# Patient Record
Sex: Female | Born: 1960 | Race: Black or African American | Hispanic: No | Marital: Single | Smoking: Current every day smoker
Health system: Southern US, Community
[De-identification: ages and names within clinical notes are randomized; demographics above are authoritative.]

## PROBLEM LIST (undated history)

## (undated) DIAGNOSIS — K219 Gastro-esophageal reflux disease without esophagitis: Secondary | ICD-10-CM

## (undated) DIAGNOSIS — D649 Anemia, unspecified: Secondary | ICD-10-CM

## (undated) DIAGNOSIS — C50919 Malignant neoplasm of unspecified site of unspecified female breast: Secondary | ICD-10-CM

## (undated) DIAGNOSIS — Z923 Personal history of irradiation: Secondary | ICD-10-CM

## (undated) HISTORY — PX: EXPLORATORY LAPAROTOMY: SUR591

## (undated) HISTORY — PX: COLONOSCOPY: SHX174

## (undated) HISTORY — PX: ABDOMINAL HYSTERECTOMY: SHX81

## (undated) HISTORY — DX: Anemia, unspecified: D64.9

---

## 2001-08-20 ENCOUNTER — Emergency Department (HOSPITAL_COMMUNITY): Admission: EM | Admit: 2001-08-20 | Discharge: 2001-08-20 | Payer: Self-pay | Admitting: Emergency Medicine

## 2001-09-12 ENCOUNTER — Emergency Department (HOSPITAL_COMMUNITY): Admission: EM | Admit: 2001-09-12 | Discharge: 2001-09-12 | Payer: Self-pay | Admitting: Emergency Medicine

## 2002-12-30 ENCOUNTER — Emergency Department (HOSPITAL_COMMUNITY): Admission: EM | Admit: 2002-12-30 | Discharge: 2002-12-30 | Payer: Self-pay | Admitting: Emergency Medicine

## 2005-12-22 ENCOUNTER — Ambulatory Visit: Payer: Self-pay | Admitting: Internal Medicine

## 2005-12-29 ENCOUNTER — Ambulatory Visit (HOSPITAL_COMMUNITY): Admission: RE | Admit: 2005-12-29 | Discharge: 2005-12-29 | Payer: Self-pay | Admitting: Internal Medicine

## 2005-12-29 ENCOUNTER — Ambulatory Visit: Payer: Self-pay | Admitting: Internal Medicine

## 2005-12-29 ENCOUNTER — Encounter (INDEPENDENT_AMBULATORY_CARE_PROVIDER_SITE_OTHER): Payer: Self-pay | Admitting: Specialist

## 2005-12-30 ENCOUNTER — Ambulatory Visit (HOSPITAL_COMMUNITY): Admission: RE | Admit: 2005-12-30 | Discharge: 2005-12-30 | Payer: Self-pay | Admitting: Internal Medicine

## 2006-01-07 ENCOUNTER — Ambulatory Visit: Payer: Self-pay | Admitting: Internal Medicine

## 2006-02-25 ENCOUNTER — Ambulatory Visit: Payer: Self-pay | Admitting: Gastroenterology

## 2006-08-04 ENCOUNTER — Ambulatory Visit (HOSPITAL_COMMUNITY): Admission: RE | Admit: 2006-08-04 | Discharge: 2006-08-04 | Payer: Self-pay | Admitting: Pulmonary Disease

## 2006-08-26 ENCOUNTER — Ambulatory Visit (HOSPITAL_COMMUNITY): Admission: RE | Admit: 2006-08-26 | Discharge: 2006-08-26 | Payer: Self-pay | Admitting: Pulmonary Disease

## 2007-11-22 ENCOUNTER — Ambulatory Visit (HOSPITAL_COMMUNITY): Admission: RE | Admit: 2007-11-22 | Discharge: 2007-11-22 | Payer: Self-pay | Admitting: Pulmonary Disease

## 2009-05-10 ENCOUNTER — Ambulatory Visit (HOSPITAL_COMMUNITY): Admission: RE | Admit: 2009-05-10 | Discharge: 2009-05-10 | Payer: Self-pay | Admitting: Pulmonary Disease

## 2010-07-26 NOTE — Op Note (Signed)
NAME:  Maria Kirby, Maria Kirby                 ACCOUNT NO.:  [id number]   MEDICAL RECORD NO.:  [id number]          PATIENT TYPE:  AMB   LOCATION:  DAY                           FACILITY:  [facility]   PHYSICIAN:  R. Roetta Sessions, M.D. DATE OF BIRTH:  January 03, 1961   DATE OF PROCEDURE:  12/29/2005  DATE OF DISCHARGE:                                 OPERATIVE REPORT   PROCEDURE:  Colonoscopy, ileoscopy, biopsy, snare polypectomy ablation.   INDICATIONS FOR PROCEDURE:  The patient is a 50 year old lady with positive  family history of colon cancer in a first degree relative at a young age  with a 1-year history of diarrhea.  She is having some postprandial  retroxiphoid pain as well.  Colonoscopy is now being done.  This approach  has discussed with the patient at length.  Potential risks, benefits and  first have been reviewed, questions answered.  She is agreeable.  Please see  documentation in the medical record.   PROCEDURE NOTE:  O2 saturation, blood pressure, pulse and respirations were  monitored throughout the entire procedure.   CONSCIOUS SEDATION:  Versed 4 mg IV, Demerol 75 mg IV.   INSTRUMENT:  Olympus video chip system.   FINDINGS:  Digital rectal exam revealed no abnormalities.   ENDOSCOPIC FINDINGS:  The prep was good.  Rectum:  Examination of rectal mucosa including retroflex view of anal verge  revealed no abnormalities.  Colon:  Colonic mucosa was surveyed from the rectosigmoid junction through  the left, transverse, right colon to appendiceal orifice, ileocecal valve  and cecum.  Distal 20 cm terminal ileum was also inspected.  From this level  scope slowly withdrawn and all previously mentioned  mucosal surfaces were  again seen.  The terminal ileal mucosa appeared normal.  The patient had  multiple diminutive polyps in the sigmoid colon, had one 6 mm flat sessile  polyp which was removed with hot snare technique recovered through the  scope.  Stool sample suctioned out for  microbiology studies.  Biopsies of  sigmoid colon taken to rule out microscopic colitis.  Remainder of colonic  mucosa appeared normal.  The patient tolerated the procedure well, was  reacted in endoscopy.   IMPRESSION:  Diminutive sigmoid polyps ablated.  One flat polyp removed with  snare.  Remainder of rectum colonic mucosa appeared normal.  Stool sample  collected.   We will follow up on the biopsies and stool samples from today.  Will make  further recommendations.  As a separate issue she has postprandial abdominal  pain which is suspicious for biliary origin.  Will go ahead and get an  outpatient abdominal ultrasound and make further recommendations in the very  near future.      Jonathon Bellows, M.D.  Electronically Signed     RMR/MEDQ  D:  12/29/2005  T:  12/30/2005  Job:  [id number]   cc:   Ramon Dredge L. Juanetta Gosling, M.D.  Fax: [phone]

## 2010-07-26 NOTE — Consult Note (Signed)
[name], Kirby                 ACCOUNT NO.:  [id number]   MEDICAL RECORD NO.:  [id number]          PATIENT TYPE:  AMB   LOCATION:                                FACILITY:  [facility]   PHYSICIAN:  R. Roetta Sessions, M.D. DATE OF BIRTH:  04/24/1960   DATE OF CONSULTATION:  12/22/2005  DATE OF DISCHARGE:                                   CONSULTATION   REASON FOR CONSULTATION:  Diarrhea, positive family history of colon cancer.   Maria Kirby is a pleasant 50 year old African-American female who was  sent to the emergency room by Dr. Shaune Pollack.  __________ one-year history  of __________  worsening diarrhea.  She tells me that prior to this past  year, she had 1-2 formed bowel movements daily.  Now she has at least 4  watery, loose, nonbloody bowel movements daily, and sometimes at least  double that amount.  She sometimes gets lower abdominal cramps and diarrhea  after she eats a meal.  She does not have nocturnal diarrhea.  She has  gained 10 pounds over the past one year.  She has not had any blood per  rectum.   FAMILY HISTORY:  Colon cancer in her father who was diagnosed with the  disease at age 75.   There is no family history of inflammatory bowel disease, including UC or  Crohn's disease.   She does not really give a history consistent with a lactose intolerance.  She has not been on antibiotics recently.  She does not have any reflux  symptoms, odynophagia or dysphagia.  She does have intermittent postprandial  retroxiphoid pain radiating into her back.  Her gallbladder remains in situ.  She rarely consumes an ibuprofen for various aches and pains.  She takes  over-the-counter sleep aids at bedtime, otherwise no prescribed medications.   PAST MEDICAL HISTORY:  Significant for hypothyroid disease, hypothyroidism.   PAST SURGICAL HISTORY:  Hysterectomy with appendectomy.  Exploratory surgery  for lower abdominal pain many years ago at [facility]; nothing  apparently was found and those symptoms resolved on their own.   MEDICATIONS:  Tylenol Extra-Strength and occasional ibuprofen, sleep aid OTC  at bedtime.   ALLERGIES:  ASPIRIN, IVP dye.   FAMILY HISTORY:  Father died with colorectal cancer at age 87.  Mother died  with diabetes-related complications at age 83.  She has one sister with  irritable bowel syndrome, otherwise negative for chronic GI or liver  illness.  One brother __________ lung cancer.   SOCIAL HISTORY:  The patient is single and she has one child.  She works for  [employer].  She smokes one-half pack of cigarettes per day.  She  does not use any alcohol or illicit drugs.   REVIEW OF SYSTEMS:  No chest pain, no dyspnea on exertion.  No fevers or  chills.  She has gained 10 pounds over the past one year, otherwise GI  review of systems as in history of present illness.   PHYSICAL EXAMINATION:  GENERAL:  Pleasant 50 year old lady resting  comfortably, weight 159, height  5 foot 8-1/2 inches.  VITAL SIGNS:  Temperature 98, blood pressure 119/70, pulse 64.  SKIN:  Warm and dry, __________ .  HEENT:  No scleral icterus.  Conjunctivae are pink.  Oral cavity - no  lesions.  CHEST:  Lungs are clear to auscultation.  CARDIOVASCULAR:  Regular rate and rhythm, no murmurs, gallops or rubs.  BREAST:  Deferred.  ABDOMEN:  Flat, positive bowel sounds, soft, nontender without appreciable  mass or organomegaly.  EXTREMITIES:  No edema.  RECTAL:  Deferred __________ colonoscopy.   IMPRESSION:  Maria Kirby is a pleasant 50 year old lady with a positive  family history at a relatively young age for colon cancer in first-degree  relative who has a one-year history of __________ progressive diarrhea.  Etiology is not clear at this time.  She really does not give a history of  any blood per rectum or any other colitis symptoms per se.  She does have  intermittent postprandial retroxiphoid pain radiating into her back which  is  suspicious for biliary origin.   RECOMMENDATIONS:  Colonoscopy in the near future.  Potential risks, benefits  and alternatives have been reviewed and questions answered.  Will also  consider a gallbladder ultrasound at some point in the near future as well.   Further recommendations to follow.   I thank Dr. Shaune Pollack for allowing me to see this nice lady today.      Jonathon Bellows, M.D.  Electronically Signed     RMR/MEDQ  D:  12/22/2005  T:  12/22/2005  Job:  [id number]

## 2010-11-29 ENCOUNTER — Encounter: Payer: Self-pay | Admitting: Internal Medicine

## 2011-02-17 ENCOUNTER — Telehealth: Payer: Self-pay | Admitting: Pulmonary Disease

## 2011-02-17 NOTE — Telephone Encounter (Signed)
Wrong patient.  We wait for correct patient to call back, as this is the only number patient gave.

## 2011-02-17 NOTE — Telephone Encounter (Signed)
I have called # listed and is no one their by this name. I called #'s listed in chart and did not know who who we where. Will send back to melanie.

## 2011-10-03 ENCOUNTER — Other Ambulatory Visit (HOSPITAL_COMMUNITY): Payer: Self-pay | Admitting: Pulmonary Disease

## 2011-10-03 DIAGNOSIS — M542 Cervicalgia: Secondary | ICD-10-CM

## 2011-10-06 ENCOUNTER — Ambulatory Visit (HOSPITAL_COMMUNITY)
Admission: RE | Admit: 2011-10-06 | Discharge: 2011-10-06 | Disposition: A | Discharge status: Home / Self Care | Payer: BC Managed Care – PPO | Source: Ambulatory Visit | Attending: Pulmonary Disease | Admitting: Pulmonary Disease

## 2011-10-06 ENCOUNTER — Other Ambulatory Visit (HOSPITAL_COMMUNITY): Payer: Self-pay | Admitting: Pulmonary Disease

## 2011-10-06 DIAGNOSIS — M542 Cervicalgia: Secondary | ICD-10-CM | POA: Insufficient documentation

## 2011-10-06 DIAGNOSIS — R52 Pain, unspecified: Secondary | ICD-10-CM

## 2011-10-06 DIAGNOSIS — M4802 Spinal stenosis, cervical region: Secondary | ICD-10-CM | POA: Insufficient documentation

## 2014-02-15 ENCOUNTER — Ambulatory Visit (INDEPENDENT_AMBULATORY_CARE_PROVIDER_SITE_OTHER): Payer: BC Managed Care – PPO | Admitting: Advanced Practice Midwife

## 2014-02-15 ENCOUNTER — Encounter: Payer: Self-pay | Admitting: Advanced Practice Midwife

## 2014-02-15 VITALS — BP 110/64 | Ht 68.0 in | Wt 169.0 lb

## 2014-02-15 DIAGNOSIS — Z01419 Encounter for gynecological examination (general) (routine) without abnormal findings: Secondary | ICD-10-CM

## 2014-02-15 NOTE — Patient Instructions (Signed)
[phone] to schedule mammogram

## 2014-02-15 NOTE — Progress Notes (Signed)
Maria Kirby 53 y.o.  Filed Vitals:   02/15/14 1545  BP: 110/64     Past Medical History: Past Medical History  Diagnosis Date  . Anemia     Past Surgical History: Past Surgical History  Procedure Laterality Date  . Abdominal hysterectomy    . Exploratory laparotomy      Family History: Family History  Problem Relation Age of Onset  . Diabetes Mother   . Hypertension Mother   . Cancer Father     colon    Social History: History  Substance Use Topics  . Smoking status: Current Every Day Smoker -- 0.50 packs/day    Types: Cigarettes  . Smokeless tobacco: Never Used  . Alcohol Use: No    Allergies:  Allergies  Allergen Reactions  . Dye Fdc Red [Red Dye]      Current outpatient prescriptions: TACLONEX external suspension, Apply 1 drop topically daily. , Disp: , Rfl: 3  History of Present Illness: Here for gyn exam and physical.  Had ab hyst for fibroids.  Sees Dr. Luan Pulling for PCP.  No c/o. Only 1 mammogram >10 years ago (didn't know she needed them every year)  Had colonsocopy age 53 after a bout of C Diff.     Review of Systems   Patient denies any headaches, blurred vision, shortness of breath, chest pain, abdominal pain, problems with bowel movements, urination, or intercourse.   Physical Exam: General:  Well developed, well nourished, no acute distress Skin:  Warm and dry Neck:  Midline trachea, normal thyroid Lungs; Clear to auscultation bilaterally Breast:  No dominant palpable mass, retraction, or nipple discharge Cardiovascular: Regular rate and rhythm Abdomen:  Soft, non tender, no hepatosplenomegaly Pelvic:  External genitalia is normal in appearance.  The vagina is normal in appearance. No adnexal masses or tenderness noted.  Rectum:  hemocult negative Extremities:  No swelling or varicosities noted Psych:  No mood changes.     Impression: Normal GYN exam     Plan: mammogram asap

## 2014-04-05 ENCOUNTER — Other Ambulatory Visit (HOSPITAL_COMMUNITY): Payer: Self-pay | Admitting: Pulmonary Disease

## 2014-04-05 DIAGNOSIS — Z1231 Encounter for screening mammogram for malignant neoplasm of breast: Secondary | ICD-10-CM

## 2014-04-10 ENCOUNTER — Ambulatory Visit (HOSPITAL_COMMUNITY)
Admission: RE | Admit: 2014-04-10 | Discharge: 2014-04-10 | Disposition: A | Discharge status: Home / Self Care | Payer: BLUE CROSS/BLUE SHIELD | Source: Ambulatory Visit | Attending: Pulmonary Disease | Admitting: Pulmonary Disease

## 2014-04-10 DIAGNOSIS — Z1231 Encounter for screening mammogram for malignant neoplasm of breast: Secondary | ICD-10-CM | POA: Diagnosis not present

## 2014-04-11 ENCOUNTER — Other Ambulatory Visit: Payer: Self-pay | Admitting: Pulmonary Disease

## 2014-04-11 DIAGNOSIS — R928 Other abnormal and inconclusive findings on diagnostic imaging of breast: Secondary | ICD-10-CM

## 2014-04-25 ENCOUNTER — Ambulatory Visit (HOSPITAL_COMMUNITY)
Admission: RE | Admit: 2014-04-25 | Discharge: 2014-04-25 | Disposition: A | Discharge status: Home / Self Care | Payer: BLUE CROSS/BLUE SHIELD | Source: Ambulatory Visit | Attending: Pulmonary Disease | Admitting: Pulmonary Disease

## 2014-04-25 DIAGNOSIS — R928 Other abnormal and inconclusive findings on diagnostic imaging of breast: Secondary | ICD-10-CM | POA: Diagnosis present

## 2014-10-04 ENCOUNTER — Other Ambulatory Visit (HOSPITAL_COMMUNITY): Payer: Self-pay | Admitting: Pulmonary Disease

## 2014-10-04 DIAGNOSIS — Z780 Asymptomatic menopausal state: Secondary | ICD-10-CM

## 2014-10-10 ENCOUNTER — Ambulatory Visit (HOSPITAL_COMMUNITY)
Admission: RE | Admit: 2014-10-10 | Discharge: 2014-10-10 | Disposition: A | Discharge status: Home / Self Care | Payer: BLUE CROSS/BLUE SHIELD | Source: Ambulatory Visit | Attending: Pulmonary Disease | Admitting: Pulmonary Disease

## 2014-10-10 ENCOUNTER — Other Ambulatory Visit: Payer: Self-pay | Admitting: Pulmonary Disease

## 2014-10-10 ENCOUNTER — Other Ambulatory Visit (HOSPITAL_COMMUNITY): Payer: BLUE CROSS/BLUE SHIELD

## 2014-10-10 DIAGNOSIS — Z720 Tobacco use: Secondary | ICD-10-CM | POA: Insufficient documentation

## 2014-10-10 DIAGNOSIS — R921 Mammographic calcification found on diagnostic imaging of breast: Secondary | ICD-10-CM | POA: Insufficient documentation

## 2014-10-10 DIAGNOSIS — Z780 Asymptomatic menopausal state: Secondary | ICD-10-CM

## 2014-10-10 DIAGNOSIS — R928 Other abnormal and inconclusive findings on diagnostic imaging of breast: Secondary | ICD-10-CM

## 2014-10-10 DIAGNOSIS — R29890 Loss of height: Secondary | ICD-10-CM | POA: Insufficient documentation

## 2015-04-04 ENCOUNTER — Other Ambulatory Visit: Payer: Self-pay | Admitting: Pulmonary Disease

## 2015-04-04 DIAGNOSIS — N6320 Unspecified lump in the left breast, unspecified quadrant: Secondary | ICD-10-CM

## 2015-04-05 ENCOUNTER — Other Ambulatory Visit: Payer: Self-pay | Admitting: Pulmonary Disease

## 2015-04-05 DIAGNOSIS — N6320 Unspecified lump in the left breast, unspecified quadrant: Secondary | ICD-10-CM

## 2015-04-17 ENCOUNTER — Ambulatory Visit (HOSPITAL_COMMUNITY)
Admission: RE | Admit: 2015-04-17 | Discharge: 2015-04-17 | Disposition: A | Discharge status: Home / Self Care | Payer: BLUE CROSS/BLUE SHIELD | Source: Ambulatory Visit | Attending: Pulmonary Disease | Admitting: Pulmonary Disease

## 2015-04-17 DIAGNOSIS — N63 Unspecified lump in breast: Secondary | ICD-10-CM | POA: Diagnosis not present

## 2015-04-17 DIAGNOSIS — N6320 Unspecified lump in the left breast, unspecified quadrant: Secondary | ICD-10-CM

## 2015-09-17 ENCOUNTER — Telehealth: Payer: Self-pay | Admitting: Internal Medicine

## 2015-09-17 NOTE — Telephone Encounter (Signed)
Letter mailed to pt.  

## 2015-09-17 NOTE — Telephone Encounter (Signed)
AUG RECALL FOR 10 YR TCS

## 2015-10-09 ENCOUNTER — Telehealth: Payer: Self-pay

## 2015-10-09 NOTE — Telephone Encounter (Signed)
[phone]  pt received letter to schedule tcs

## 2015-10-18 NOTE — Telephone Encounter (Signed)
LMOM to call.

## 2015-10-19 ENCOUNTER — Telehealth: Payer: Self-pay

## 2015-10-19 NOTE — Telephone Encounter (Signed)
Pt received a triage letter from [facility]. Please call [phone]

## 2015-10-19 NOTE — Telephone Encounter (Signed)
See triage

## 2015-10-31 NOTE — Telephone Encounter (Signed)
Gastroenterology Pre-Procedure Review  Request Date: 10/19/2015 Requesting Physician: ON RECALL FOR 10 YEAR COLONOSCOPY  PATIENT REVIEW QUESTIONS: The patient responded to the following health history questions as indicated:    1. Diabetes Melitis: no 2. Joint replacements in the past 12 months: no 3. Major health problems in the past 3 months: no 4. Has an artificial valve or MVP: no 5. Has a defibrillator: no 6. Has been advised in past to take antibiotics in advance of a procedure like teeth cleaning: no 7. Family history of colon cancer: YES/ FATHER AGE 52 8. Alcohol Use: no 9. History of sleep apnea: no     MEDICATIONS & ALLERGIES:    Patient reports the following regarding taking any blood thinners:   Plavix? no Aspirin? no Coumadin? no  Patient confirms/reports the following medications:  Current Outpatient Prescriptions  Medication Sig Dispense Refill  . TACLONEX external suspension Apply 1 drop topically daily.   3   No current facility-administered medications for this visit.     Patient confirms/reports the following allergies:  Allergies  Allergen Reactions  . Dye Fdc Red [Red Dye]     No orders of the defined types were placed in this encounter.   AUTHORIZATION INFORMATION Primary Insurance:   ID #:  Group #:  Pre-Cert / Auth required  Pre-Cert / Auth #:   Secondary Insurance: ID #:  Group #:  Pre-Cert / Auth required:  Pre-Cert / Auth #:   SCHEDULE INFORMATION: Procedure has been scheduled as follows:  Date: 11/07/2015              Time:  8:30 sM Location: [facility] Short Stay  This Gastroenterology Pre-Precedure Review Form is being routed to the following provider(s): R. Garfield Cornea, MD

## 2015-11-05 ENCOUNTER — Other Ambulatory Visit: Payer: Self-pay

## 2015-11-05 DIAGNOSIS — Z800 Family history of malignant neoplasm of digestive organs: Secondary | ICD-10-CM

## 2015-11-05 MED ORDER — PEG 3350-KCL-NA BICARB-NACL 420 G PO SOLR: 4000.0000 mL | ORAL | 0 refills | Status: DC

## 2015-11-05 NOTE — Telephone Encounter (Signed)
Appropriate.

## 2015-11-05 NOTE — Telephone Encounter (Signed)
I [facility] and also [facility] to [facility] with [facility].  I called pt and LMOM that it has been sent in and to make sure she picks up enema and Ducolax tablets at the pharmacy when she picks up the prescription.  I also left Vm that she should begin clear liquids today after her lunch meal and remain on clear liquids.

## 2015-11-05 NOTE — Addendum Note (Signed)
Addended by: Everardo All on: 11/05/2015 11:23 AM   Modules accepted: Orders

## 2015-11-06 ENCOUNTER — Telehealth: Payer: Self-pay

## 2015-11-06 NOTE — Telephone Encounter (Signed)
I called [employer] @ [phone] and got the automated machine. NO PA required for the screening colonoscopy. Reference # [id number] Confirmation # [id number]

## 2015-11-07 ENCOUNTER — Encounter (HOSPITAL_COMMUNITY)
Admission: RE | Disposition: A | Discharge status: Home / Self Care | Payer: Self-pay | Source: Ambulatory Visit | Attending: Internal Medicine

## 2015-11-07 ENCOUNTER — Encounter (HOSPITAL_COMMUNITY): Payer: Self-pay | Admitting: *Deleted

## 2015-11-07 ENCOUNTER — Ambulatory Visit (HOSPITAL_COMMUNITY)
Admission: RE | Admit: 2015-11-07 | Discharge: 2015-11-07 | Disposition: A | Discharge status: Home / Self Care | Payer: BLUE CROSS/BLUE SHIELD | Source: Ambulatory Visit | Attending: Internal Medicine | Admitting: Internal Medicine

## 2015-11-07 DIAGNOSIS — F17210 Nicotine dependence, cigarettes, uncomplicated: Secondary | ICD-10-CM | POA: Diagnosis not present

## 2015-11-07 DIAGNOSIS — D123 Benign neoplasm of transverse colon: Secondary | ICD-10-CM | POA: Diagnosis not present

## 2015-11-07 DIAGNOSIS — K635 Polyp of colon: Secondary | ICD-10-CM | POA: Diagnosis not present

## 2015-11-07 DIAGNOSIS — Z1211 Encounter for screening for malignant neoplasm of colon: Secondary | ICD-10-CM | POA: Diagnosis not present

## 2015-11-07 DIAGNOSIS — Z800 Family history of malignant neoplasm of digestive organs: Secondary | ICD-10-CM | POA: Diagnosis not present

## 2015-11-07 HISTORY — PX: COLONOSCOPY: SHX5424

## 2015-11-07 SURGERY — COLONOSCOPY
Anesthesia: Moderate Sedation

## 2015-11-07 MED ORDER — ONDANSETRON HCL 4 MG/2ML IJ SOLN
INTRAMUSCULAR | Status: DC | PRN
  Administered 2015-11-07: 4 mg via INTRAVENOUS

## 2015-11-07 MED ORDER — STERILE WATER FOR IRRIGATION IR SOLN
Status: DC | PRN
  Administered 2015-11-07: 09:00:00

## 2015-11-07 MED ORDER — MIDAZOLAM HCL 5 MG/5ML IJ SOLN
INTRAMUSCULAR | Status: AC
  Filled 2015-11-07: qty 10

## 2015-11-07 MED ORDER — MEPERIDINE HCL 100 MG/ML IJ SOLN
INTRAMUSCULAR | Status: DC | PRN
  Administered 2015-11-07: 25 mg via INTRAVENOUS
  Administered 2015-11-07: 50 mg via INTRAVENOUS

## 2015-11-07 MED ORDER — SODIUM CHLORIDE 0.9 % IV SOLN
INTRAVENOUS | Status: DC
  Administered 2015-11-07: 08:00:00 via INTRAVENOUS

## 2015-11-07 MED ORDER — MIDAZOLAM HCL 5 MG/5ML IJ SOLN
INTRAMUSCULAR | Status: DC | PRN
  Administered 2015-11-07: 2 mg via INTRAVENOUS
  Administered 2015-11-07 (×2): 1 mg via INTRAVENOUS
  Administered 2015-11-07: 2 mg via INTRAVENOUS

## 2015-11-07 MED ORDER — ONDANSETRON HCL 4 MG/2ML IJ SOLN
INTRAMUSCULAR | Status: AC
  Filled 2015-11-07: qty 2

## 2015-11-07 MED ORDER — MEPERIDINE HCL 100 MG/ML IJ SOLN
INTRAMUSCULAR | Status: AC
  Filled 2015-11-07: qty 2

## 2015-11-07 NOTE — Op Note (Signed)
[facility] Patient Name: Maria Kirby Procedure Date: 11/07/2015 8:38 AM MRN: [id number] Date of Birth: 01/11/1961 Attending MD: Norvel Richards , MD CSN: [id number] Age: 55 Admit Type: Outpatient Procedure:                Colonoscopy with multiple snare polypectomies Indications:              Screening in patient at increased risk: Colorectal                            cancer in father before age 2 Providers:                Norvel Richards, MD, Janeece Riggers, RN, Randa Spike, Technician Referring MD:              Medicines:                Midazolam 6 mg IV, Meperidine 175 mg IV,                            Ondansetron 4 mg IV Complications:            No immediate complications. Estimated Blood Loss:     Estimated blood loss was minimal. Procedure:                Pre-Anesthesia Assessment:                           - Prior to the procedure, a History and Physical                            was performed, and patient medications and                            allergies were reviewed. The patient's tolerance of                            previous anesthesia was also reviewed. The risks                            and benefits of the procedure and the sedation                            options and risks were discussed with the patient.                            All questions were answered, and informed consent                            was obtained. Prior Anticoagulants: The patient has                            taken no previous anticoagulant or antiplatelet  agents. ASA Grade Assessment: II - A patient with                            mild systemic disease. After reviewing the risks                            and benefits, the patient was deemed in                            satisfactory condition to undergo the procedure.                           After obtaining informed consent, the colonoscope             was passed under direct vision. Throughout the                            procedure, the patient's blood pressure, pulse, and                            oxygen saturations were monitored continuously. The                            EC-[id number] scope was introduced through                            the anus and advanced to the the cecum, identified                            by appendiceal orifice and ileocecal valve. The                            colonoscopy was performed without difficulty. The                            patient tolerated the procedure well. The quality                            of the bowel preparation was adequate. The                            ileocecal valve, appendiceal orifice, and rectum                            were photographed. Scope In: 8:59:19 AM Scope Out: 9:19:08 AM Scope Withdrawal Time: 0 hours 15 minutes 48 seconds  Total Procedure Duration: 0 hours 19 minutes 49 seconds  Findings:      The perianal and digital rectal examinations were normal.      (2) 4 mm polyppolyps were found in the splenic flexure. The polyps were       semi-pedunculated. The polyps were removed with cold snare. Resection       and retrieval were complete. Estimated blood loss: minimal. (1)9 mm       sessile polyp in the same area was removed with  hot snare technique.      No additional abnormalities were found on retroflexion. Impression:               - One 4 mm polyp at the splenic flexure, removed                            with a hot snare. Resected and retrieved. Moderate Sedation:      Moderate (conscious) sedation was administered by the endoscopy nurse       and supervised by the endoscopist. The following parameters were       monitored: oxygen saturation, heart rate, blood pressure, respiratory       rate, EKG, adequacy of pulmonary ventilation, and response to care.       Total physician intraservice time was 33 minutes. Recommendation:            - Patient has a contact number available for                            emergencies. The signs and symptoms of potential                            delayed complications were discussed with the                            patient. Return to normal activities tomorrow.                            Written discharge instructions were provided to the                            patient.                           - Advance diet as tolerated.                           - Continue present medications.                           - Repeat colonoscopy after studies are complete for                            surveillance based on pathology results.                           - Return to GI office (date not yet determined). Procedure Code(s):        --- Professional ---                           [phone], Colonoscopy, flexible; with removal of                            tumor(s), polyp(s), or other lesion(s) by snare                            technique  [id number], Moderate sedation services provided by the                            same physician or other qualified health care                            professional performing the diagnostic or                            therapeutic service that the sedation supports,                            requiring the presence of an independent trained                            observer to assist in the monitoring of the                            patient's level of consciousness and physiological                            status; initial 15 minutes of intraservice time,                            patient age 18 years or older                           [phone], Moderate sedation services; each additional                            15 minutes intraservice time Diagnosis Code(s):        --- Professional ---                           Z80.0, Family history of malignant neoplasm of                            digestive organs                            D12.3, Benign neoplasm of transverse colon (hepatic                            flexure or splenic flexure) CPT copyright 2016 [employer]. All rights reserved. The codes documented in this report are preliminary and upon coder review may  be revised to meet current compliance requirements. Maria Kirby. Maria Focht, MD Norvel Richards, MD 11/07/2015 9:31:39 AM This report has been signed electronically. Number of Addenda: 0

## 2015-11-07 NOTE — Discharge Instructions (Addendum)
°Colonoscopy °Discharge Instructions ° °Read the instructions outlined below and refer to this sheet in the next few weeks. These discharge instructions provide you with general information on caring for yourself after you leave the hospital. Your doctor may also give you specific instructions. While your treatment has been planned according to the most current medical practices available, unavoidable complications occasionally occur. If you have any problems or questions after discharge, call Dr. Rourk at [phone]. °ACTIVITY °· You may resume your regular activity, but move at a slower pace for the next 24 hours.  °· Take frequent rest periods for the next 24 hours.  °· Walking will help get rid of the air and reduce the bloated feeling in your belly (abdomen).  °· No driving for 24 hours (because of the medicine (anesthesia) used during the test).   °· Do not sign any important legal documents or operate any machinery for 24 hours (because of the anesthesia used during the test).  °NUTRITION °· Drink plenty of fluids.  °· You may resume your normal diet as instructed by your doctor.  °· Begin with a light meal and progress to your normal diet. Heavy or fried foods are harder to digest and may make you feel sick to your stomach (nauseated).  °· Avoid alcoholic beverages for 24 hours or as instructed.  °MEDICATIONS °· You may resume your normal medications unless your doctor tells you otherwise.  °WHAT YOU CAN EXPECT TODAY °· Some feelings of bloating in the abdomen.  °· Passage of more gas than usual.  °· Spotting of blood in your stool or on the toilet paper.  °IF YOU HAD POLYPS REMOVED DURING THE COLONOSCOPY: °· No aspirin products for 7 days or as instructed.  °· No alcohol for 7 days or as instructed.  °· Eat a soft diet for the next 24 hours.  °FINDING OUT THE RESULTS OF YOUR TEST °Not all test results are available during your visit. If your test results are not back during the visit, make an appointment  with your caregiver to find out the results. Do not assume everything is normal if you have not heard from your caregiver or the medical facility. It is important for you to follow up on all of your test results.  °SEEK IMMEDIATE MEDICAL ATTENTION IF: °· You have more than a spotting of blood in your stool.  °· Your belly is swollen (abdominal distention).  °· You are nauseated or vomiting.  °· You have a temperature over 101.  °· You have abdominal pain or discomfort that is severe or gets worse throughout the day.  ° ° °Colon polyp information provided °Further recommendations to follow pending review of pathology report ° ° °Colon Polyps °Polyps are lumps of extra tissue growing inside the body. Polyps can grow in the large intestine (colon). Most colon polyps are noncancerous (benign). However, some colon polyps can become cancerous over time. Polyps that are larger than a pea may be harmful. To be safe, caregivers remove and test all polyps. °CAUSES  °Polyps form when mutations in the genes cause your cells to grow and divide even though no more tissue is needed. °RISK FACTORS °There are a number of risk factors that can increase your chances of getting colon polyps. They include: °· Being older than 50 years. °· Family history of colon polyps or colon cancer. °· Long-term colon diseases, such as colitis or Crohn disease. °· Being overweight. °· Smoking. °· Being inactive. °· Drinking too much alcohol. °  SYMPTOMS  °Most small polyps do not cause symptoms. If symptoms are present, they may include: °· Blood in the stool. The stool may look dark red or black. °· Constipation or diarrhea that lasts longer than 1 week. °DIAGNOSIS °People often do not know they have polyps until their caregiver finds them during a regular checkup. Your caregiver can use 4 tests to check for polyps: °· Digital rectal exam. The caregiver wears gloves and feels inside the rectum. This test would find polyps only in the rectum. °· Barium  enema. The caregiver puts a liquid called barium into your rectum before taking X-rays of your colon. Barium makes your colon look white. Polyps are dark, so they are easy to see in the X-ray pictures. °· Sigmoidoscopy. A thin, flexible tube (sigmoidoscope) is placed into your rectum. The sigmoidoscope has a light and tiny camera in it. The caregiver uses the sigmoidoscope to look at the last third of your colon. °· Colonoscopy. This test is like sigmoidoscopy, but the caregiver looks at the entire colon. This is the most common method for finding and removing polyps. °TREATMENT  °Any polyps will be removed during a sigmoidoscopy or colonoscopy. The polyps are then tested for cancer. °PREVENTION  °To help lower your risk of getting more colon polyps: °· Eat plenty of fruits and vegetables. Avoid eating fatty foods. °· Do not smoke. °· Avoid drinking alcohol. °· Exercise every day. °· Lose weight if recommended by your caregiver. °· Eat plenty of calcium and folate. Foods that are rich in calcium include milk, cheese, and broccoli. Foods that are rich in folate include chickpeas, kidney beans, and spinach. °HOME CARE INSTRUCTIONS °Keep all follow-up appointments as directed by your caregiver. You may need periodic exams to check for polyps. °SEEK MEDICAL CARE IF: °You notice bleeding during a bowel movement. °  °This information is not intended to replace advice given to you by your health care provider. Make sure you discuss any questions you have with your health care provider. °  °Document Released: 11/21/2003 Document Revised: 03/17/2014 Document Reviewed: 05/06/2011 °Elsevier Interactive Patient Education ©2016 [employer] ° °

## 2015-11-07 NOTE — H&P (Signed)
@  [id number]   Primary Care Physician:  Alonza Bogus, MD Primary Gastroenterologist:  Dr. Gala Romney  Pre-Procedure History & Physical: HPI:  Maria Kirby is a 55 y.o. female is here for a screening colonoscopy. Father with colon cancer age 27. Negative colonoscopy 2007-hyperplastic polyp. No bowel symptoms currently.  Past Medical History:  Diagnosis Date  . Anemia     Past Surgical History:  Procedure Laterality Date  . ABDOMINAL HYSTERECTOMY    . COLONOSCOPY    . EXPLORATORY LAPAROTOMY      Prior to Admission medications   Medication Sig Start Date End Date Taking? Authorizing Provider  polyethylene glycol-electrolytes (TRILYTE) 420 g solution Take 4,000 mLs by mouth as directed. 11/05/15  Yes Daneil Dolin, MD    Allergies as of 11/05/2015 - Review Complete 11/05/2015  Allergen Reaction Noted  . Dye fdc red [red dye]  02/15/2014    Family History  Problem Relation Age of Onset  . Diabetes Mother   . Hypertension Mother   . Cancer Father     colon  . Colon cancer Father     Social History   Social History  . Marital status: Single    Spouse name: N/A  . Number of children: N/A  . Years of education: N/A   Occupational History  . Not on file.   Social History Main Topics  . Smoking status: Current Every Day Smoker    Packs/day: 0.50    Years: 30.00    Types: Cigarettes  . Smokeless tobacco: Never Used  . Alcohol use No  . Drug use: No  . Sexual activity: Not Currently   Other Topics Concern  . Not on file   Social History Narrative  . No narrative on file    Review of Systems: See HPI, otherwise negative ROS  Physical Exam: BP 121/71   Pulse 68   Temp 98.2 F (36.8 C) (Oral)   Resp 15   Ht 5' 7.5" (1.715 m)   Wt 155 lb (70.3 kg)   SpO2 100%   BMI 23.92 kg/m  General:   Alert,  Well-developed, well-nourished, pleasant and cooperative in NAD Head:  Normocephalic and atraumatic. Eyes:  Sclera clear, no icterus.   Conjunctiva pink. Lungs:   Clear throughout to auscultation.   No wheezes, crackles, or rhonchi. No acute distress. Heart:  Regular rate and rhythm; no murmurs, clicks, rubs,  or gallops. Abdomen:  Soft, nontender and nondistended. No masses, hepatosplenomegaly or hernias noted. Normal bowel sounds, without guarding, and without rebound.    Impression/Plan: Maria Kirby is now here to undergo a screening colonoscopy.  High-risk screening examination.  Risks, benefits, limitations, imponderables and alternatives regarding colonoscopy have been reviewed with the patient. Questions have been answered. All parties agreeable.     Notice:  This dictation was prepared with Dragon dictation along with smaller phrase technology. Any transcriptional errors that result from this process are unintentional and may not be corrected upon review.

## 2015-11-09 ENCOUNTER — Encounter: Payer: Self-pay | Admitting: Internal Medicine

## 2015-11-13 ENCOUNTER — Encounter (HOSPITAL_COMMUNITY): Payer: Self-pay | Admitting: Internal Medicine

## 2016-04-08 ENCOUNTER — Other Ambulatory Visit (HOSPITAL_COMMUNITY): Payer: Self-pay | Admitting: Pulmonary Disease

## 2016-04-08 DIAGNOSIS — R921 Mammographic calcification found on diagnostic imaging of breast: Secondary | ICD-10-CM

## 2016-04-08 DIAGNOSIS — R928 Other abnormal and inconclusive findings on diagnostic imaging of breast: Secondary | ICD-10-CM

## 2016-04-29 ENCOUNTER — Ambulatory Visit (HOSPITAL_COMMUNITY)
Admission: RE | Admit: 2016-04-29 | Discharge: 2016-04-29 | Disposition: A | Discharge status: Home / Self Care | Payer: Commercial Managed Care - PPO | Source: Ambulatory Visit | Attending: Pulmonary Disease | Admitting: Pulmonary Disease

## 2016-04-29 ENCOUNTER — Encounter (HOSPITAL_COMMUNITY): Payer: Self-pay

## 2016-04-29 DIAGNOSIS — R921 Mammographic calcification found on diagnostic imaging of breast: Secondary | ICD-10-CM | POA: Insufficient documentation

## 2016-04-29 DIAGNOSIS — R928 Other abnormal and inconclusive findings on diagnostic imaging of breast: Secondary | ICD-10-CM | POA: Diagnosis not present

## 2016-04-29 HISTORY — DX: Malignant neoplasm of unspecified site of unspecified female breast: C50.919

## 2016-04-29 HISTORY — DX: Personal history of irradiation: Z92.3

## 2016-05-07 DIAGNOSIS — M25861 Other specified joint disorders, right knee: Secondary | ICD-10-CM | POA: Diagnosis not present

## 2016-05-07 DIAGNOSIS — L409 Psoriasis, unspecified: Secondary | ICD-10-CM | POA: Diagnosis not present

## 2016-05-19 ENCOUNTER — Emergency Department (HOSPITAL_COMMUNITY)
Admission: EM | Admit: 2016-05-19 | Discharge: 2016-05-19 | Disposition: A | Discharge status: Home / Self Care | Payer: Commercial Managed Care - PPO | Attending: Emergency Medicine | Admitting: Emergency Medicine

## 2016-05-19 ENCOUNTER — Encounter (HOSPITAL_COMMUNITY): Payer: Self-pay | Admitting: *Deleted

## 2016-05-19 ENCOUNTER — Emergency Department (HOSPITAL_COMMUNITY): Payer: Commercial Managed Care - PPO

## 2016-05-19 DIAGNOSIS — R1013 Epigastric pain: Secondary | ICD-10-CM | POA: Diagnosis not present

## 2016-05-19 DIAGNOSIS — R079 Chest pain, unspecified: Secondary | ICD-10-CM | POA: Diagnosis not present

## 2016-05-19 DIAGNOSIS — R110 Nausea: Secondary | ICD-10-CM | POA: Insufficient documentation

## 2016-05-19 DIAGNOSIS — F17210 Nicotine dependence, cigarettes, uncomplicated: Secondary | ICD-10-CM | POA: Insufficient documentation

## 2016-05-19 DIAGNOSIS — R1031 Right lower quadrant pain: Secondary | ICD-10-CM | POA: Diagnosis not present

## 2016-05-19 DIAGNOSIS — K921 Melena: Secondary | ICD-10-CM | POA: Insufficient documentation

## 2016-05-19 LAB — COMPREHENSIVE METABOLIC PANEL
ALBUMIN: 4.2 g/dL (ref 3.5–5.0)
ALT: 16 U/L (ref 14–54)
AST: 14 U/L — ABNORMAL LOW (ref 15–41)
Alkaline Phosphatase: 76 U/L (ref 38–126)
Anion gap: 8 (ref 5–15)
BUN: 8 mg/dL (ref 6–20)
CHLORIDE: 105 mmol/L (ref 101–111)
CO2: 24 mmol/L (ref 22–32)
CREATININE: 0.73 mg/dL (ref 0.44–1.00)
Calcium: 9.4 mg/dL (ref 8.9–10.3)
GFR calc non Af Amer: >60 mL/min (ref >60)
Glucose, Bld: 117 mg/dL — ABNORMAL HIGH (ref 65–99)
Potassium: 3.6 mmol/L (ref 3.5–5.1)
Sodium: 137 mmol/L (ref 135–145)
Total Bilirubin: 0.2 mg/dL — ABNORMAL LOW (ref 0.3–1.2)
Total Protein: 7.6 g/dL (ref 6.5–8.1)

## 2016-05-19 LAB — CBC WITH DIFFERENTIAL/PLATELET
BASOS PCT: 0 %
Basophils Absolute: 0 10*3/uL (ref 0.0–0.1)
EOS PCT: 1 %
Eosinophils Absolute: 0.1 10*3/uL (ref 0.0–0.7)
HEMATOCRIT: 44.8 % (ref 36.0–46.0)
HEMOGLOBIN: 15.6 g/dL — AB (ref 12.0–15.0)
LYMPHS PCT: 61 %
Lymphs Abs: 6.8 10*3/uL — ABNORMAL HIGH (ref 0.7–4.0)
MCH: 30.5 pg (ref 26.0–34.0)
MCHC: 34.8 g/dL (ref 30.0–36.0)
MCV: 87.5 fL (ref 78.0–100.0)
MONO ABS: 0.8 10*3/uL (ref 0.1–1.0)
Monocytes Relative: 7 %
NEUTROS PCT: 31 %
Neutro Abs: 3.5 10*3/uL (ref 1.7–7.7)
Platelets: 209 10*3/uL (ref 150–400)
RBC: 5.12 MIL/uL — ABNORMAL HIGH (ref 3.87–5.11)
RDW: 15.2 % (ref 11.5–15.5)
WBC: 11.2 10*3/uL — ABNORMAL HIGH (ref 4.0–10.5)

## 2016-05-19 LAB — POC OCCULT BLOOD, ED: Fecal Occult Bld: NEGATIVE

## 2016-05-19 LAB — I-STAT TROPONIN, ED: Troponin i, poc: 0 ng/mL (ref 0.00–0.08)

## 2016-05-19 LAB — LIPASE, BLOOD: Lipase: 41 U/L (ref 11–51)

## 2016-05-19 MED ORDER — FENTANYL CITRATE (PF) 100 MCG/2ML IJ SOLN
50.0000 ug | Freq: Once | INTRAMUSCULAR | Status: AC
  Administered 2016-05-19: 50 ug via INTRAVENOUS
  Filled 2016-05-19: qty 2

## 2016-05-19 MED ORDER — FENTANYL CITRATE (PF) 100 MCG/2ML IJ SOLN
50.0000 ug | INTRAMUSCULAR | Status: DC | PRN
  Administered 2016-05-19: 50 ug via INTRAVENOUS
  Filled 2016-05-19: qty 2

## 2016-05-19 MED ORDER — ONDANSETRON HCL 4 MG/2ML IJ SOLN
4.0000 mg | Freq: Once | INTRAMUSCULAR | Status: AC
  Administered 2016-05-19: 4 mg via INTRAVENOUS
  Filled 2016-05-19: qty 2

## 2016-05-19 MED ORDER — BARIUM SULFATE 2.1 % PO SUSP
ORAL | Status: AC
  Filled 2016-05-19: qty 2

## 2016-05-19 MED ORDER — FAMOTIDINE 20 MG PO TABS: 20.0000 mg | ORAL_TABLET | Freq: Two times a day (BID) | ORAL | 0 refills | Status: DC

## 2016-05-19 NOTE — ED Triage Notes (Signed)
Pt c/o mid center chest pain that started yesterday, pt states that she thought it was indigestion but it has gotten worse today,

## 2016-05-19 NOTE — Discharge Instructions (Signed)
If you were given medicines take as directed.  If you are on coumadin or contraceptives realize their levels and effectiveness is altered by many different medicines.  If you have any reaction (rash, tongues swelling, other) to the medicines stop taking and see a physician.    If your blood pressure was elevated in the ER make sure you follow up for management with a primary doctor or return for chest pain, shortness of breath or stroke symptoms.  Please follow up as directed and return to the ER or see a physician for new or worsening symptoms.  Thank you. Vitals:   05/19/16 0800 05/19/16 0830 05/19/16 0901 05/19/16 0930  BP: 124/70 114/72 139/67 125/97  Pulse: 62 61 65 62  Resp: 14 15 13 14   Temp:      TempSrc:      SpO2: 99% 98% 99% 100%  Weight:      Height:

## 2016-05-19 NOTE — ED Provider Notes (Signed)
Patient care signed out to follow-up CT scan to base final disposition. Patient's pain improved on reassessment. Patient overall well-appearing. Epigastric pain on exam. CT scan no acute findings. Patient stable for outpatient follow-up with primary doctor.  Ct Abdomen Pelvis Wo Contrast  Result Date: 05/19/2016 CLINICAL DATA:  Epigastric and right lower quadrant pain. Allergy to IV dye EXAM: CT ABDOMEN AND PELVIS WITHOUT CONTRAST TECHNIQUE: Multidetector CT imaging of the abdomen and pelvis was performed following the standard protocol without IV contrast. COMPARISON:  None. FINDINGS: Lower chest:  No contributory findings. Hepatobiliary: 2 subcentimeter low densities in the right liver are too small for densitometry.No evidence of biliary obstruction or stone. Pancreas: Unremarkable. Spleen: Unremarkable. Adrenals/Urinary Tract: Negative adrenals. No hydronephrosis or stone. Unremarkable bladder. Stomach/Bowel:  No obstruction. No appendicitis. Vascular/Lymphatic: No acute vascular abnormality. No mass or adenopathy. Reproductive:Hysterectomy and possible oophorectomies. Other: No ascites or pneumoperitoneum. Musculoskeletal: Degenerative change without acute abnormalities. IMPRESSION: No acute finding.  Normal appendix. Electronically Signed   By: Monte Fantasia M.D.   On: 05/19/2016 11:13   Dg Abd Acute W/chest  Result Date: 05/19/2016 CLINICAL DATA:  Onset of mid chest pain yesterday which has worsened. History of breast malignancy, radiation therapy, daily smoker. EXAM: DG ABDOMEN ACUTE W/ 1V CHEST COMPARISON:  PA and lateral chest x-ray of August 26, 2006 FINDINGS: The lungs are adequately inflated and clear. The heart and pulmonary vascularity are normal. The mediastinum is normal in width. There is no pleural effusion. There is calcification in the wall of the aortic arch. The observed bony structures are unremarkable. Within the abdomen the bowel gas pattern is normal. There are numerous pelvic  phleboliths. No definite urinary tract stones or gallstones are observed. The bony structures are unremarkable. IMPRESSION: There is no acute cardiopulmonary abnormality. No acute intra-abdominal abnormality is observed. Thoracic aortic atherosclerosis. Electronically Signed   By: David  Martinique M.D.   On: 05/19/2016 07:35   Mm Diag Breast Tomo Bilateral  Result Date: 04/29/2016 CLINICAL DATA:  Annual examination and completion of a 2 year follow-up of probably benign calcifications in the left breast. The patient is asymptomatic. EXAM: 2D DIGITAL DIAGNOSTIC BILATERAL MAMMOGRAM WITH CAD AND ADJUNCT TOMO COMPARISON:  04/17/2015, 10/10/2014, 04/10/2014 ACR Breast Density Category c: The breast tissue is heterogeneously dense, which may obscure small masses. FINDINGS: No mass, architectural distortion, or suspicious microcalcification is identified in either breast. Magnification views of the outer left breast show a stable group of benign-appearing calcifications spanning 7 mm. Mammographic images were processed with CAD. IMPRESSION: Benign-appearing calcifications in the outer left breast have remained benign-appearing for 2 years and can be considered benign. No evidence of malignancy in either breast. RECOMMENDATION: Screening mammogram in one year.(Code:[id number]-B-01Y) I have discussed the findings and recommendations with the patient. Results were also provided in writing at the conclusion of the visit. If applicable, a reminder letter will be sent to the patient regarding the next appointment. BI-RADS CATEGORY  2: Benign. Electronically Signed   By: Curlene Dolphin M.D.   On: 04/29/2016 14:53      Elnora Morrison, MD 05/19/16 1155

## 2016-05-19 NOTE — ED Provider Notes (Signed)
[facility] DEPT Provider Note   CSN: [id number] Arrival date & time: 05/19/16  0526     History   Chief Complaint Chief Complaint  Patient presents with  . Chest Pain    HPI CARYNN FELLING is a 56 y.o. female.  The history is provided by the patient.  Abdominal Pain   This is a new problem. The current episode started yesterday. The problem occurs constantly. The problem has been gradually worsening. The pain is located in the epigastric region and chest. The pain is severe. Associated symptoms include melena and nausea. Pertinent negatives include fever, diarrhea, vomiting and constipation. The symptoms are aggravated by palpation and certain positions. Nothing relieves the symptoms. Her past medical history does not include PUD.  Patient reports onset of epigastric/lower chest pain yesterday She reports it "feels like ripping" and also cramping She reports she was recently on solumedrol recently for arthralgias (6 day course) but completed that med last week and pain started yesterday She reports recent black stools No vomiting   Past Medical History:  Diagnosis Date  . Anemia   . Breast cancer ([facility])    left breast cancer  . Personal history of radiation therapy     There are no active problems to display for this patient.   Past Surgical History:  Procedure Laterality Date  . ABDOMINAL HYSTERECTOMY    . COLONOSCOPY    . COLONOSCOPY N/A 11/07/2015   Procedure: COLONOSCOPY;  Surgeon: Daneil Dolin, MD;  Location: [facility];  Service: Endoscopy;  Laterality: N/A;  8:30 Am  . EXPLORATORY LAPAROTOMY      OB History    No data available       Home Medications    Prior to Admission medications   Medication Sig Start Date End Date Taking? Authorizing Provider  polyethylene glycol-electrolytes (TRILYTE) 420 g solution Take 4,000 mLs by mouth as directed. 11/05/15   Daneil Dolin, MD    Family History Family History  Problem Relation Age of Onset  .  Diabetes Mother   . Hypertension Mother   . Cancer Father     colon  . Colon cancer Father     Social History Social History  Substance Use Topics  . Smoking status: Current Every Day Smoker    Packs/day: 0.50    Years: 30.00    Types: Cigarettes  . Smokeless tobacco: Never Used  . Alcohol use No     Allergies   Dye fdc red [red dye]   Review of Systems Review of Systems  Constitutional: Negative for fever.  Cardiovascular: Positive for chest pain.  Gastrointestinal: Positive for abdominal pain, melena and nausea. Negative for constipation, diarrhea and vomiting.  Neurological: Negative for syncope.  All other systems reviewed and are negative.    Physical Exam Updated Vital Signs BP 132/73   Pulse 69   Temp 97.6 F (36.4 C) (Oral)   Resp 12   Ht 5\' 7"  (1.702 m)   Wt 70.3 kg   SpO2 99%   BMI 24.28 kg/m   Physical Exam CONSTITUTIONAL: Well developed/well nourished, uncomfortable appearing HEAD: Normocephalic/atraumatic EYES: EOMI ENMT: Mucous membranes moist NECK: supple no meningeal signs SPINE/BACK:entire spine nontender CV: S1/S2 noted, no murmurs/rubs/gallops noted LUNGS: Lungs are clear to auscultation bilaterally, no apparent distress ABDOMEN: soft,moderate epigastric tenderness.  +bowel sounds noted throughout Rectal - stool color yellow/brown.  No melena noted.  Hemoccult negative.  Female chaperone present for exam GU:no cva tenderness NEURO: Pt is awake/alert/appropriate, moves  all extremitiesx4.  No facial droop.   EXTREMITIES: pulses normal/equal, full ROM SKIN: warm, color normal PSYCH: no abnormalities of mood noted, alert and oriented to situation   ED Treatments / Results  Labs (all labs ordered are listed, but only abnormal results are displayed) Labs Reviewed  COMPREHENSIVE METABOLIC PANEL - Abnormal; Notable for the following:       Result Value   Glucose, Bld 117 (*)    AST 14 (*)    Total Bilirubin 0.2 (*)    All other  components within normal limits  CBC WITH DIFFERENTIAL/PLATELET - Abnormal; Notable for the following:    WBC 11.2 (*)    RBC 5.12 (*)    Hemoglobin 15.6 (*)    Lymphs Abs 6.8 (*)    All other components within normal limits  LIPASE, BLOOD  I-STAT TROPOININ, ED  POC OCCULT BLOOD, ED    EKG  EKG Interpretation  Date/Time:  Monday May 19 2016 05:36:42 EDT Ventricular Rate:  81 PR Interval:    QRS Duration: 80 QT Interval:  369 QTC Calculation: 429 R Axis:   76 Text Interpretation:  Sinus rhythm Biatrial enlargement Minimal ST depression, inferior leads Interpretation limited secondary to artifact No previous ECGs available Confirmed by Christy Gentles  MD, Tylicia Sherman ([id number]) on 05/19/2016 5:40:51 AM       Radiology Dg Abd Acute W/chest  Result Date: 05/19/2016 CLINICAL DATA:  Onset of mid chest pain yesterday which has worsened. History of breast malignancy, radiation therapy, daily smoker. EXAM: DG ABDOMEN ACUTE W/ 1V CHEST COMPARISON:  PA and lateral chest x-ray of August 26, 2006 FINDINGS: The lungs are adequately inflated and clear. The heart and pulmonary vascularity are normal. The mediastinum is normal in width. There is no pleural effusion. There is calcification in the wall of the aortic arch. The observed bony structures are unremarkable. Within the abdomen the bowel gas pattern is normal. There are numerous pelvic phleboliths. No definite urinary tract stones or gallstones are observed. The bony structures are unremarkable. IMPRESSION: There is no acute cardiopulmonary abnormality. No acute intra-abdominal abnormality is observed. Thoracic aortic atherosclerosis. Electronically Signed   By: David  Martinique M.D.   On: 05/19/2016 07:35    Procedures Procedures (including critical care time)  Medications Ordered in ED Medications  fentaNYL (SUBLIMAZE) injection 50 mcg (50 mcg Intravenous Given 05/19/16 0640)  ondansetron (ZOFRAN) injection 4 mg (4 mg Intravenous Given 05/19/16 0641)      Initial Impression / Assessment and Plan / ED Course  I have reviewed the triage vital signs and the nursing notes.  Pertinent labs & imaging results that were available during my care of the patient were reviewed by me and considered in my medical decision making      7:37 AM Pt here with onset of abdominal pain yesterday, mostly in epigastric region.  She reported it did move into her chest, but currently pain is in abdomen After pain meds, she is tender in epigastric region and RLQ region Due to severity/location of pain, CT imaging ordered  Pt did report recent black stool but no anemia and negative hemoccult, and this was after recent steroids, she will need to stop any future NSAIDs/steroids  Pt with dye allergy, CT abd/pelvis ordered without contrast  Signed out to dr Reather Converse to f/u on CT imaging   Final Clinical Impressions(s) / ED Diagnoses   Final diagnoses:  Epigastric abdominal pain  RLQ abdominal pain    New Prescriptions New Prescriptions   No  medications on file     Ripley Fraise, MD 05/19/16 [phone]

## 2016-05-20 DIAGNOSIS — T50905A Adverse effect of unspecified drugs, medicaments and biological substances, initial encounter: Secondary | ICD-10-CM | POA: Diagnosis not present

## 2016-05-20 DIAGNOSIS — M129 Arthropathy, unspecified: Secondary | ICD-10-CM | POA: Diagnosis not present

## 2016-05-20 DIAGNOSIS — R109 Unspecified abdominal pain: Secondary | ICD-10-CM | POA: Diagnosis not present

## 2016-05-22 ENCOUNTER — Encounter: Payer: Self-pay | Admitting: Internal Medicine

## 2016-06-13 ENCOUNTER — Other Ambulatory Visit: Payer: Self-pay

## 2016-06-13 ENCOUNTER — Ambulatory Visit (INDEPENDENT_AMBULATORY_CARE_PROVIDER_SITE_OTHER): Payer: Commercial Managed Care - PPO | Admitting: Gastroenterology

## 2016-06-13 ENCOUNTER — Encounter: Payer: Self-pay | Admitting: Gastroenterology

## 2016-06-13 DIAGNOSIS — R1310 Dysphagia, unspecified: Secondary | ICD-10-CM | POA: Diagnosis not present

## 2016-06-13 DIAGNOSIS — R1013 Epigastric pain: Secondary | ICD-10-CM

## 2016-06-13 DIAGNOSIS — K921 Melena: Secondary | ICD-10-CM

## 2016-06-13 DIAGNOSIS — R1319 Other dysphagia: Secondary | ICD-10-CM

## 2016-06-13 NOTE — Patient Instructions (Addendum)
1. Upper endoscopy with Dr. Gala Romney. Please see separate instructions. 2. Continue pantoprazole 40 mg daily. Call with any problems in the meantime, pending upper endoscopy

## 2016-06-13 NOTE — Assessment & Plan Note (Signed)
56 year old female with recent acute onset epigastric pain after use of Medrol dose pack. Described several episodes of black watery stool. No melena rectal exam and heme negative in the ED. Hemoglobin unremarkable. Now on pantoprazole with 50% improvement of symptoms but persistent early satiety, epigastric discomfort and now with 2 week history of esophageal dysphagia. Recommend EGD with possible esophageal dilation in the near future. Patient require significant amounts of conscious sedation with her colonoscopy previously therefore we will plan on deep sedation in the OR. I have discussed the risks, alternatives, benefits with regards to but not limited to the risk of reaction to medication, bleeding, infection, perforation and the patient is agreeable to proceed. Written consent to be obtained.  Continue pantoprazole 40 mg daily. Call with any worsening symptoms in the interim.

## 2016-06-13 NOTE — Progress Notes (Signed)
Primary Care Physician:  Alonza Bogus, MD  Primary Gastroenterologist:  Garfield Cornea, MD   Chief Complaint  Patient presents with  . Abdominal Pain    HPI:  Maria Kirby is a 56 y.o. female here For further evaluation of abdominal pain and black stool. Patient had recent acute onset upper abdominal pain severe enough she went to emergency department. Cardiac workup was unremarkable. CT of abdomen pelvis without contrast was unremarkable to explain pain. She had 2 subcentimeter low densities in the right liver too small for densitometry. Plain abdominal films unremarkable. Heme negative. No melena on rectal exam in the ED. Labs unremarkable as outlined below with the exception of white blood cell count 11,200.  Described pain as tearing. Has been unable to eat as much, describes early satiety. Symptoms all started after taking Medrol Dosepak. Past several black liquid stools day of ER evaluation. Denies Pepto. Seen by PCP started her on pantoprazole. She's been on it for 3 weeks. About 50% improvement of symptoms. Denies NSAID or aspirin use. Denies dysphagia, vomiting, rectal bleeding, constipation, diarrhea.   Over the past 2 weeks she's had esophageal dysphagia to solid foods.     Current Outpatient Prescriptions  Medication Sig Dispense Refill  . pantoprazole (PROTONIX) 40 MG tablet Take 40 mg by mouth daily.     No current facility-administered medications for this visit.     Allergies as of 06/13/2016 - Review Complete 06/13/2016  Allergen Reaction Noted  . Dye fdc red [red dye]  02/15/2014  . Ivp dye [iodinated diagnostic agents]  06/13/2016    Past Medical History:  Diagnosis Date  . Anemia   . Breast cancer ([place])    left breast cancer  . Personal history of radiation therapy     Past Surgical History:  Procedure Laterality Date  . ABDOMINAL HYSTERECTOMY    . COLONOSCOPY    . COLONOSCOPY N/A 11/07/2015   Dr. Gala Romney: two 4 mm polyps in one 9 mm removed from the  splenic flexure, one tubular adenoma. Colonoscopy in August 2022.   Marland Kitchen EXPLORATORY LAPAROTOMY      Family History  Problem Relation Age of Onset  . Diabetes Mother   . Hypertension Mother   . Cancer Father 76    colon  . Colon cancer Father     Social History   Social History  . Marital status: Single    Spouse name: N/A  . Number of children: N/A  . Years of education: N/A   Occupational History  . Not on file.   Social History Main Topics  . Smoking status: Current Every Day Smoker    Packs/day: 0.50    Years: 30.00    Types: Cigarettes  . Smokeless tobacco: Never Used  . Alcohol use No  . Drug use: No  . Sexual activity: Not Currently   Other Topics Concern  . Not on file   Social History Narrative  . No narrative on file      ROS:  General: Negative for anorexia, weight loss, fever, chills, fatigue, weakness. Eyes: Negative for vision changes.  ENT: Negative for hoarseness,  nasal congestion. See history of present illness CV: Negative for chest pain, angina, palpitations, dyspnea on exertion, peripheral edema.  Respiratory: Negative for dyspnea at rest, dyspnea on exertion, cough, sputum, wheezing.  GI: See history of present illness. GU:  Negative for dysuria, hematuria, urinary incontinence, urinary frequency, nocturnal urination.  MS: Negative for joint pain, low back pain.  Derm: Negative for  rash or itching.  Neuro: Negative for weakness, abnormal sensation, seizure, frequent headaches, memory loss, confusion.  Psych: Negative for anxiety, depression, suicidal ideation, hallucinations.  Endo: Negative for unusual weight change.  Heme: Negative for bruising or bleeding. Allergy: Negative for rash or hives.    Physical Examination:  BP 121/67   Pulse 79   Temp 98.2 F (36.8 C) (Oral)   Ht 5\' 7"  (1.702 m)   Wt 159 lb (72.1 kg)   BMI 24.90 kg/m    General: Well-nourished, well-developed in no acute distress.  Head: Normocephalic,  atraumatic.   Eyes: Conjunctiva pink, no icterus. Mouth: Oropharyngeal mucosa moist and pink , no lesions erythema or exudate. Neck: Supple without thyromegaly, masses, or lymphadenopathy.  Lungs: Clear to auscultation bilaterally.  Heart: Regular rate and rhythm, no murmurs rubs or gallops.  Abdomen: Bowel sounds are normal, moderate epigastric tenderness, nondistended, no hepatosplenomegaly or masses, no abdominal bruits or    hernia , no rebound or guarding.   Rectal: Not performed Extremities: No lower extremity edema. No clubbing or deformities.  Neuro: Alert and oriented x 4 , grossly normal neurologically.  Skin: Warm and dry, no rash or jaundice.   Psych: Alert and cooperative, normal mood and affect.  Labs: Lab Results  Component Value Date   CREATININE 0.73 05/19/2016   BUN 8 05/19/2016   NA 137 05/19/2016   K 3.6 05/19/2016   CL 105 05/19/2016   CO2 24 05/19/2016   Lab Results  Component Value Date   ALT 16 05/19/2016   AST 14 (L) 05/19/2016   ALKPHOS 76 05/19/2016   BILITOT 0.2 (L) 05/19/2016    Lab Results  Component Value Date   LIPASE 41 05/19/2016   Lab Results  Component Value Date   WBC 11.2 (H) 05/19/2016   HGB 15.6 (H) 05/19/2016   HCT 44.8 05/19/2016   MCV 87.5 05/19/2016   PLT 209 05/19/2016     Imaging Studies: Ct Abdomen Pelvis Wo Contrast  Result Date: 05/19/2016 CLINICAL DATA:  Epigastric and right lower quadrant pain. Allergy to IV dye EXAM: CT ABDOMEN AND PELVIS WITHOUT CONTRAST TECHNIQUE: Multidetector CT imaging of the abdomen and pelvis was performed following the standard protocol without IV contrast. COMPARISON:  None. FINDINGS: Lower chest:  No contributory findings. Hepatobiliary: 2 subcentimeter low densities in the right liver are too small for densitometry.No evidence of biliary obstruction or stone. Pancreas: Unremarkable. Spleen: Unremarkable. Adrenals/Urinary Tract: Negative adrenals. No hydronephrosis or stone. Unremarkable  bladder. Stomach/Bowel:  No obstruction. No appendicitis. Vascular/Lymphatic: No acute vascular abnormality. No mass or adenopathy. Reproductive:Hysterectomy and possible oophorectomies. Other: No ascites or pneumoperitoneum. Musculoskeletal: Degenerative change without acute abnormalities. IMPRESSION: No acute finding.  Normal appendix. Electronically Signed   By: Monte Fantasia M.D.   On: 05/19/2016 11:13   Dg Abd Acute W/chest  Result Date: 05/19/2016 CLINICAL DATA:  Onset of mid chest pain yesterday which has worsened. History of breast malignancy, radiation therapy, daily smoker. EXAM: DG ABDOMEN ACUTE W/ 1V CHEST COMPARISON:  PA and lateral chest x-ray of August 26, 2006 FINDINGS: The lungs are adequately inflated and clear. The heart and pulmonary vascularity are normal. The mediastinum is normal in width. There is no pleural effusion. There is calcification in the wall of the aortic arch. The observed bony structures are unremarkable. Within the abdomen the bowel gas pattern is normal. There are numerous pelvic phleboliths. No definite urinary tract stones or gallstones are observed. The bony structures are unremarkable. IMPRESSION: There is  no acute cardiopulmonary abnormality. No acute intra-abdominal abnormality is observed. Thoracic aortic atherosclerosis. Electronically Signed   By: David  Martinique M.D.   On: 05/19/2016 07:35

## 2016-06-16 NOTE — Progress Notes (Signed)
cc'd to pcp 

## 2016-06-23 NOTE — Progress Notes (Signed)
REVIEWED-NO ADDITIONAL RECOMMENDATIONS. 

## 2016-07-14 NOTE — Patient Instructions (Signed)
Maria Kirby  07/14/2016     @PREFPERIOPPHARMACY @   Your procedure is scheduled on  07/21/2016   Report to [facility] at  615   A.M.  Call this number if you have problems the morning of surgery:  [phone]   Remember:  Do not eat food or drink liquids after midnight.  Take these medicines the morning of surgery with A SIP OF WATER  protonix.   Do not wear jewelry, make-up or nail polish.  Do not wear lotions, powders, or perfumes, or deoderant.  Do not shave 48 hours prior to surgery.  Men may shave face and neck.  Do not bring valuables to the hospital.  [facility] is not responsible for any belongings or valuables.  Contacts, dentures or bridgework may not be worn into surgery.  Leave your suitcase in the car.  After surgery it may be brought to your room.  For patients admitted to the hospital, discharge time will be determined by your treatment team.  Patients discharged the day of surgery will not be allowed to drive home.   Name and phone number of your driver:   family Special instructions:  Follow the diet instructions given to you by Dr Roseanne Kaufman office.  Please read over the following fact sheets that you were given. Anesthesia Post-op Instructions and Care and Recovery After Surgery       Esophagogastroduodenoscopy Esophagogastroduodenoscopy (EGD) is a procedure to examine the lining of the esophagus, stomach, and first part of the small intestine (duodenum). This procedure is done to check for problems such as inflammation, bleeding, ulcers, or growths. During this procedure, a long, flexible, lighted tube with a camera attached (endoscope) is inserted down the throat. Tell a health care provider about:  Any allergies you have.  All medicines you are taking, including vitamins, herbs, eye drops, creams, and over-the-counter medicines.  Any problems you or family members have had with anesthetic medicines.  Any blood disorders you  have.  Any surgeries you have had.  Any medical conditions you have.  Whether you are pregnant or may be pregnant. What are the risks? Generally, this is a safe procedure. However, problems may occur, including:  Infection.  Bleeding.  A tear (perforation) in the esophagus, stomach, or duodenum.  Trouble breathing.  Excessive sweating.  Spasms of the larynx.  A slowed heartbeat.  Low blood pressure. What happens before the procedure?  Follow instructions from your health care provider about eating or drinking restrictions.  Ask your health care provider about:  Changing or stopping your regular medicines. This is especially important if you are taking diabetes medicines or blood thinners.  Taking medicines such as aspirin and ibuprofen. These medicines can thin your blood. Do not take these medicines before your procedure if your health care provider instructs you not to.  Plan to have someone take you home after the procedure.  If you wear dentures, be ready to remove them before the procedure. What happens during the procedure?  To reduce your risk of infection, your health care team will wash or sanitize their hands.  An IV tube will be put in a vein in your hand or arm. You will get medicines and fluids through this tube.  You will be given one or more of the following:  A medicine to help you relax (sedative).  A medicine to numb the area (local anesthetic). This medicine may be sprayed into  your throat. It will make you feel more comfortable and keep you from gagging or coughing during the procedure.  A medicine for pain.  A mouth guard may be placed in your mouth to protect your teeth and to keep you from biting on the endoscope.  You will be asked to lie on your left side.  The endoscope will be lowered down your throat into your esophagus, stomach, and duodenum.  Air will be put into the endoscope. This will help your health care provider see  better.  The lining of your esophagus, stomach, and duodenum will be examined.  Your health care provider may:  Take a tissue sample so it can be looked at in a lab (biopsy).  Remove growths.  Remove objects (foreign bodies) that are stuck.  Treat any bleeding with medicines or other devices that stop tissue from bleeding.  Widen (dilate) or stretch narrowed areas of your esophagus and stomach.  The endoscope will be taken out. The procedure may vary among health care providers and hospitals. What happens after the procedure?  Your blood pressure, heart rate, breathing rate, and blood oxygen level will be monitored often until the medicines you were given have worn off.  Do not eat or drink anything until the numbing medicine has worn off and your gag reflex has returned. This information is not intended to replace advice given to you by your health care provider. Make sure you discuss any questions you have with your health care provider. Document Released: 06/27/2004 Document Revised: 08/02/2015 Document Reviewed: 01/18/2015 Elsevier Interactive Patient Education  2017 [place]. Esophagogastroduodenoscopy, Care After Refer to this sheet in the next few weeks. These instructions provide you with information about caring for yourself after your procedure. Your health care provider may also give you more specific instructions. Your treatment has been planned according to current medical practices, but problems sometimes occur. Call your health care provider if you have any problems or questions after your procedure. What can I expect after the procedure? After the procedure, it is common to have:  A sore throat.  Nausea.  Bloating.  Dizziness.  Fatigue. Follow these instructions at home:  Do not eat or drink anything until the numbing medicine (local anesthetic) has worn off and your gag reflex has returned. You will know that the local anesthetic has worn off when you  can swallow comfortably.  Do not drive for 24 hours if you received a medicine to help you relax (sedative).  If your health care provider took a tissue sample for testing during the procedure, make sure to get your test results. This is your responsibility. Ask your health care provider or the department performing the test when your results will be ready.  Keep all follow-up visits as told by your health care provider. This is important. Contact a health care provider if:  You cannot stop coughing.  You are not urinating.  You are urinating less than usual. Get help right away if:  You have trouble swallowing.  You cannot eat or drink.  You have throat or chest pain that gets worse.  You are dizzy or light-headed.  You faint.  You have nausea or vomiting.  You have chills.  You have a fever.  You have severe abdominal pain.  You have black, tarry, or bloody stools. This information is not intended to replace advice given to you by your health care provider. Make sure you discuss any questions you have with your health care provider.  Document Released: 02/11/2012 Document Revised: 08/02/2015 Document Reviewed: 01/18/2015 Elsevier Interactive Patient Education  2017 [employer]  Esophageal Dilatation Esophageal dilatation is a procedure to open a blocked or narrowed part of the esophagus. The esophagus is the long tube in your throat that carries food and liquid from your mouth to your stomach. The procedure is also called esophageal dilation. You may need this procedure if you have a buildup of scar tissue in your esophagus that makes it difficult, painful, or even impossible to swallow. This can be caused by gastroesophageal reflux disease (GERD). In rare cases, people need this procedure because they have cancer of the esophagus or a problem with the way food moves through the esophagus. Sometimes you may need to have another dilatation to enlarge the opening of the  esophagus gradually. Tell a health care provider about:  Any allergies you have.  All medicines you are taking, including vitamins, herbs, eye drops, creams, and over-the-counter medicines.  Any problems you or family members have had with anesthetic medicines.  Any blood disorders you have.  Any surgeries you have had.  Any medical conditions you have.  Any antibiotic medicines you are required to take before dental procedures. What are the risks? Generally, this is a safe procedure. However, problems can occur and include:  Bleeding from a tear in the lining of the esophagus.  A hole (perforation) in the esophagus. What happens before the procedure?  Do not eat or drink anything after midnight on the night before the procedure or as directed by your health care provider.  Ask your health care provider about changing or stopping your regular medicines. This is especially important if you are taking diabetes medicines or blood thinners.  Plan to have someone take you home after the procedure. What happens during the procedure?  You will be given a medicine that makes you relaxed and sleepy (sedative).  A medicine may be sprayed or gargled to numb the back of the throat.  Your health care provider can use various instruments to do an esophageal dilatation. During the procedure, the instrument used will be placed in your mouth and passed down into your esophagus. Options include:  Simple dilators. This instrument is carefully placed in the esophagus to stretch it.  Guided wire bougies. In this method, a flexible tube (endoscope) is used to insert a wire into the esophagus. The dilator is passed over this wire to enlarge the esophagus. Then the wire is removed.  Balloon dilators. An endoscope with a small balloon at the end is passed down into the esophagus. Inflating the balloon gently stretches the esophagus and opens it up. What happens after the procedure?  Your blood  pressure, heart rate, breathing rate, and blood oxygen level will be monitored often until the medicines you were given have worn off.  Your throat may feel slightly sore and will probably still feel numb. This will improve slowly over time.  You will not be allowed to eat or drink until the throat numbness has resolved.  If this is a same-day procedure, you may be allowed to go home once you have been able to drink, urinate, and sit on the edge of the bed without nausea or dizziness.  If this is a same-day procedure, you should have a friend or family member with you for the next 24 hours after the procedure. This information is not intended to replace advice given to you by your health care provider. Make sure you discuss any questions  you have with your health care provider. Document Released: 04/17/2005 Document Revised: 08/02/2015 Document Reviewed: 07/06/2013 Elsevier Interactive Patient Education  2017 [place] Anesthesia is a term that refers to techniques, procedures, and medicines that help a person stay safe and comfortable during a medical procedure. Monitored anesthesia care, or sedation, is one type of anesthesia. Your anesthesia specialist may recommend sedation if you will be having a procedure that does not require you to be unconscious, such as:  Cataract surgery.  A dental procedure.  A biopsy.  A colonoscopy. During the procedure, you may receive a medicine to help you relax (sedative). There are three levels of sedation:  Mild sedation. At this level, you may feel awake and relaxed. You will be able to follow directions.  Moderate sedation. At this level, you will be sleepy. You may not remember the procedure.  Deep sedation. At this level, you will be asleep. You will not remember the procedure. The more medicine you are given, the deeper your level of sedation will be. Depending on how you respond to the procedure, the anesthesia  specialist may change your level of sedation or the type of anesthesia to fit your needs. An anesthesia specialist will monitor you closely during the procedure. Let your health care provider know about:  Any allergies you have.  All medicines you are taking, including vitamins, herbs, eye drops, creams, and over-the-counter medicines.  Any use of steroids (by mouth or as a cream).  Any problems you or family members have had with sedatives and anesthetic medicines.  Any blood disorders you have.  Any surgeries you have had.  Any medical conditions you have, such as sleep apnea.  Whether you are pregnant or may be pregnant.  Any use of cigarettes, alcohol, or street drugs. What are the risks? Generally, this is a safe procedure. However, problems may occur, including:  Getting too much medicine (oversedation).  Nausea.  Allergic reaction to medicines.  Trouble breathing. If this happens, a breathing tube may be used to help with breathing. It will be removed when you are awake and breathing on your own.  Heart trouble.  Lung trouble. Before the procedure Staying hydrated  Follow instructions from your health care provider about hydration, which may include:  Up to 2 hours before the procedure - you may continue to drink clear liquids, such as water, clear fruit juice, black coffee, and plain tea. Eating and drinking restrictions  Follow instructions from your health care provider about eating and drinking, which may include:  8 hours before the procedure - stop eating heavy meals or foods such as meat, fried foods, or fatty foods.  6 hours before the procedure - stop eating light meals or foods, such as toast or cereal.  6 hours before the procedure - stop drinking milk or drinks that contain milk.  2 hours before the procedure - stop drinking clear liquids. Medicines  Ask your health care provider about:  Changing or stopping your regular medicines. This is  especially important if you are taking diabetes medicines or blood thinners.  Taking medicines such as aspirin and ibuprofen. These medicines can thin your blood. Do not take these medicines before your procedure if your health care provider instructs you not to. Tests and exams  You will have a physical exam.  You may have blood tests done to show:  How well your kidneys and liver are working.  How well your blood can clot.  General instructions  Plan to have someone take you home from the hospital or clinic.  If you will be going home right after the procedure, plan to have someone with you for 24 hours. What happens during the procedure?  Your blood pressure, heart rate, breathing, level of pain and overall condition will be monitored.  An IV tube will be inserted into one of your veins.  Your anesthesia specialist will give you medicines as needed to keep you comfortable during the procedure. This may mean changing the level of sedation.  The procedure will be performed. After the procedure  Your blood pressure, heart rate, breathing rate, and blood oxygen level will be monitored until the medicines you were given have worn off.  Do not drive for 24 hours if you received a sedative.  You may:  Feel sleepy, clumsy, or nauseous.  Feel forgetful about what happened after the procedure.  Have a sore throat if you had a breathing tube during the procedure.  Vomit. This information is not intended to replace advice given to you by your health care provider. Make sure you discuss any questions you have with your health care provider. Document Released: 11/20/2004 Document Revised: 08/03/2015 Document Reviewed: 06/17/2015 Elsevier Interactive Patient Education  2017 [place], Care After These instructions provide you with information about caring for yourself after your procedure. Your health care provider may also give you more specific  instructions. Your treatment has been planned according to current medical practices, but problems sometimes occur. Call your health care provider if you have any problems or questions after your procedure. What can I expect after the procedure? After your procedure, it is common to:  Feel sleepy for several hours.  Feel clumsy and have poor balance for several hours.  Feel forgetful about what happened after the procedure.  Have poor judgment for several hours.  Feel nauseous or vomit.  Have a sore throat if you had a breathing tube during the procedure. Follow these instructions at home: For at least 24 hours after the procedure:    Do not:  Participate in activities in which you could fall or become injured.  Drive.  Use heavy machinery.  Drink alcohol.  Take sleeping pills or medicines that cause drowsiness.  Make important decisions or sign legal documents.  Take care of children on your own.  Rest. Eating and drinking   Follow the diet that is recommended by your health care provider.  If you vomit, drink water, juice, or soup when you can drink without vomiting.  Make sure you have little or no nausea before eating solid foods. General instructions   Have a responsible adult stay with you until you are awake and alert.  Take over-the-counter and prescription medicines only as told by your health care provider.  If you smoke, do not smoke without supervision.  Keep all follow-up visits as told by your health care provider. This is important. Contact a health care provider if:  You keep feeling nauseous or you keep vomiting.  You feel light-headed.  You develop a rash.  You have a fever. Get help right away if:  You have trouble breathing. This information is not intended to replace advice given to you by your health care provider. Make sure you discuss any questions you have with your health care provider. Document Released: 06/17/2015 Document  Revised: 10/17/2015 Document Reviewed: 06/17/2015 Elsevier Interactive Patient Education  2017 [employer].

## 2016-07-15 ENCOUNTER — Encounter (HOSPITAL_COMMUNITY): Payer: Self-pay

## 2016-07-16 ENCOUNTER — Encounter (HOSPITAL_COMMUNITY)
Admission: RE | Admit: 2016-07-16 | Discharge: 2016-07-16 | Disposition: A | Discharge status: Home / Self Care | Payer: Commercial Managed Care - PPO | Source: Ambulatory Visit | Attending: Internal Medicine | Admitting: Internal Medicine

## 2016-07-21 ENCOUNTER — Ambulatory Visit (HOSPITAL_COMMUNITY): Payer: Commercial Managed Care - PPO | Admitting: Anesthesiology

## 2016-07-21 ENCOUNTER — Encounter (HOSPITAL_COMMUNITY)
Admission: RE | Disposition: A | Discharge status: Home / Self Care | Payer: Self-pay | Source: Ambulatory Visit | Attending: Internal Medicine

## 2016-07-21 ENCOUNTER — Encounter (HOSPITAL_COMMUNITY): Payer: Self-pay | Admitting: *Deleted

## 2016-07-21 ENCOUNTER — Ambulatory Visit (HOSPITAL_COMMUNITY)
Admission: RE | Admit: 2016-07-21 | Discharge: 2016-07-21 | Disposition: A | Discharge status: Home / Self Care | Payer: Commercial Managed Care - PPO | Source: Ambulatory Visit | Attending: Internal Medicine | Admitting: Internal Medicine

## 2016-07-21 DIAGNOSIS — K911 Postgastric surgery syndromes: Secondary | ICD-10-CM | POA: Diagnosis not present

## 2016-07-21 DIAGNOSIS — F17210 Nicotine dependence, cigarettes, uncomplicated: Secondary | ICD-10-CM | POA: Insufficient documentation

## 2016-07-21 DIAGNOSIS — K219 Gastro-esophageal reflux disease without esophagitis: Secondary | ICD-10-CM | POA: Insufficient documentation

## 2016-07-21 DIAGNOSIS — Z853 Personal history of malignant neoplasm of breast: Secondary | ICD-10-CM | POA: Diagnosis not present

## 2016-07-21 DIAGNOSIS — R1013 Epigastric pain: Secondary | ICD-10-CM | POA: Diagnosis not present

## 2016-07-21 DIAGNOSIS — Z91041 Radiographic dye allergy status: Secondary | ICD-10-CM | POA: Insufficient documentation

## 2016-07-21 DIAGNOSIS — R1310 Dysphagia, unspecified: Secondary | ICD-10-CM | POA: Insufficient documentation

## 2016-07-21 DIAGNOSIS — Z923 Personal history of irradiation: Secondary | ICD-10-CM | POA: Diagnosis not present

## 2016-07-21 DIAGNOSIS — K921 Melena: Secondary | ICD-10-CM

## 2016-07-21 DIAGNOSIS — Z79899 Other long term (current) drug therapy: Secondary | ICD-10-CM | POA: Insufficient documentation

## 2016-07-21 HISTORY — PX: MALONEY DILATION: SHX5535

## 2016-07-21 HISTORY — PX: ESOPHAGOGASTRODUODENOSCOPY (EGD) WITH PROPOFOL: SHX5813

## 2016-07-21 HISTORY — DX: Gastro-esophageal reflux disease without esophagitis: K21.9

## 2016-07-21 SURGERY — ESOPHAGOGASTRODUODENOSCOPY (EGD) WITH PROPOFOL
Anesthesia: Monitor Anesthesia Care

## 2016-07-21 MED ORDER — MIDAZOLAM HCL 2 MG/2ML IJ SOLN
INTRAMUSCULAR | Status: AC
  Filled 2016-07-21: qty 2

## 2016-07-21 MED ORDER — LIDOCAINE VISCOUS 2 % MT SOLN
OROMUCOSAL | Status: DC | PRN
  Administered 2016-07-21: 1 via OROMUCOSAL

## 2016-07-21 MED ORDER — PROPOFOL 10 MG/ML IV BOLUS
INTRAVENOUS | Status: DC | PRN
  Administered 2016-07-21: 10 mg via INTRAVENOUS
  Administered 2016-07-21 (×2): 20 mg via INTRAVENOUS

## 2016-07-21 MED ORDER — FENTANYL CITRATE (PF) 100 MCG/2ML IJ SOLN
INTRAMUSCULAR | Status: AC
Start: 2016-07-21 — End: 2016-07-21
  Filled 2016-07-21: qty 2

## 2016-07-21 MED ORDER — LIDOCAINE VISCOUS 2 % MT SOLN
OROMUCOSAL | Status: AC
  Filled 2016-07-21: qty 15

## 2016-07-21 MED ORDER — CHLORHEXIDINE GLUCONATE CLOTH 2 % EX PADS: 6.0000 | MEDICATED_PAD | Freq: Once | CUTANEOUS | Status: DC

## 2016-07-21 MED ORDER — LACTATED RINGERS IV SOLN
INTRAVENOUS | Status: DC
  Administered 2016-07-21: 07:00:00 via INTRAVENOUS

## 2016-07-21 MED ORDER — PROPOFOL 500 MG/50ML IV EMUL
INTRAVENOUS | Status: DC | PRN
  Administered 2016-07-21: 150 ug/kg/min via INTRAVENOUS

## 2016-07-21 MED ORDER — MIDAZOLAM HCL 2 MG/2ML IJ SOLN
1.0000 mg | INTRAMUSCULAR | Status: DC | PRN
  Administered 2016-07-21: 2 mg via INTRAVENOUS

## 2016-07-21 MED ORDER — FENTANYL CITRATE (PF) 100 MCG/2ML IJ SOLN
25.0000 ug | INTRAMUSCULAR | Status: DC | PRN
  Administered 2016-07-21: 25 ug via INTRAVENOUS

## 2016-07-21 NOTE — Discharge Instructions (Addendum)
GERD information provided  Continue Protonix 40 mg daily  Office visit with [place] in 3 months.  Follow up Tuesday, August 14 at 10:00 am.   EGD Discharge instructions Please read the instructions outlined below and refer to this sheet in the next few weeks. These discharge instructions provide you with general information on caring for yourself after you leave the hospital. Your doctor may also give you specific instructions. While your treatment has been planned according to the most current medical practices available, unavoidable complications occasionally occur. If you have any problems or questions after discharge, please call your doctor. ACTIVITY  You may resume your regular activity but move at a slower pace for the next 24 hours.   Take frequent rest periods for the next 24 hours.   Walking will help expel (get rid of) the air and reduce the bloated feeling in your abdomen.   No driving for 24 hours (because of the anesthesia (medicine) used during the test).   You may shower.   Do not sign any important legal documents or operate any machinery for 24 hours (because of the anesthesia used during the test).  NUTRITION  Drink plenty of fluids.   You may resume your normal diet.   Begin with a light meal and progress to your normal diet.   Avoid alcoholic beverages for 24 hours or as instructed by your caregiver.  MEDICATIONS  You may resume your normal medications unless your caregiver tells you otherwise.  WHAT YOU CAN EXPECT TODAY  You may experience abdominal discomfort such as a feeling of fullness or gas pains.  FOLLOW-UP  Your doctor will discuss the results of your test with you.  SEEK IMMEDIATE MEDICAL ATTENTION IF ANY OF THE FOLLOWING OCCUR:  Excessive nausea (feeling sick to your stomach) and/or vomiting.   Severe abdominal pain and distention (swelling).   Trouble swallowing.   Temperature over 101 F (37.8 C).   Rectal bleeding or vomiting of  blood.    Gastroesophageal Reflux Disease, Adult Normally, food travels down the esophagus and stays in the stomach to be digested. However, when a person has gastroesophageal reflux disease (GERD), food and stomach acid move back up into the esophagus. When this happens, the esophagus becomes sore and inflamed. Over time, GERD can create small holes (ulcers) in the lining of the esophagus. What are the causes? This condition is caused by a problem with the muscle between the esophagus and the stomach (lower esophageal sphincter, or LES). Normally, the LES muscle closes after food passes through the esophagus to the stomach. When the LES is weakened or abnormal, it does not close properly, and that allows food and stomach acid to go back up into the esophagus. The LES can be weakened by certain dietary substances, medicines, and medical conditions, including:  Tobacco use.  Pregnancy.  Having a hiatal hernia.  Heavy alcohol use.  Certain foods and beverages, such as coffee, chocolate, onions, and peppermint. What increases the risk? This condition is more likely to develop in:  People who have an increased body weight.  People who have connective tissue disorders.  People who use NSAID medicines. What are the signs or symptoms? Symptoms of this condition include:  Heartburn.  Difficult or painful swallowing.  The feeling of having a lump in the throat.  Abitter taste in the mouth.  Bad breath.  Having a large amount of saliva.  Having an upset or bloated stomach.  Belching.  Chest pain.  Shortness of breath  or wheezing.  Ongoing (chronic) cough or a night-time cough.  Wearing away of tooth enamel.  Weight loss. Different conditions can cause chest pain. Make sure to see your health care provider if you experience chest pain. How is this diagnosed? Your health care provider will take a medical history and perform a physical exam. To determine if you have mild or  severe GERD, your health care provider may also monitor how you respond to treatment. You may also have other tests, including:  An endoscopy toexamine your stomach and esophagus with a small camera.  A test thatmeasures the acidity level in your esophagus.  A test thatmeasures how much pressure is on your esophagus.  A barium swallow or modified barium swallow to show the shape, size, and functioning of your esophagus. How is this treated? The goal of treatment is to help relieve your symptoms and to prevent complications. Treatment for this condition may vary depending on how severe your symptoms are. Your health care provider may recommend:  Changes to your diet.  Medicine.  Surgery. Follow these instructions at home: Diet   Follow a diet as recommended by your health care provider. This may involve avoiding foods and drinks such as:  Coffee and tea (with or without caffeine).  Drinks that containalcohol.  Energy drinks and sports drinks.  Carbonated drinks or sodas.  Chocolate and cocoa.  Peppermint and mint flavorings.  Garlic and onions.  Horseradish.  Spicy and acidic foods, including peppers, chili powder, curry powder, vinegar, hot sauces, and barbecue sauce.  Citrus fruit juices and citrus fruits, such as oranges, lemons, and limes.  Tomato-based foods, such as red sauce, chili, salsa, and pizza with red sauce.  Fried and fatty foods, such as donuts, french fries, potato chips, and high-fat dressings.  High-fat meats, such as hot dogs and fatty cuts of red and white meats, such as rib eye steak, sausage, ham, and bacon.  High-fat dairy items, such as whole milk, butter, and cream cheese.  Eat small, frequent meals instead of large meals.  Avoid drinking large amounts of liquid with your meals.  Avoid eating meals during the 2-3 hours before bedtime.  Avoid lying down right after you eat.  Do not exercise right after you eat. General  instructions   Pay attention to any changes in your symptoms.  Take over-the-counter and prescription medicines only as told by your health care provider. Do not take aspirin, ibuprofen, or other NSAIDs unless your health care provider told you to do so.  Do not use any tobacco products, including cigarettes, chewing tobacco, and e-cigarettes. If you need help quitting, ask your health care provider.  Wear loose-fitting clothing. Do not wear anything tight around your waist that causes pressure on your abdomen.  Raise (elevate) the head of your bed 6 inches (15cm).  Try to reduce your stress, such as with yoga or meditation. If you need help reducing stress, ask your health care provider.  If you are overweight, reduce your weight to an amount that is healthy for you. Ask your health care provider for guidance about a safe weight loss goal.  Keep all follow-up visits as told by your health care provider. This is important. Contact a health care provider if:  You have new symptoms.  You have unexplained weight loss.  You have difficulty swallowing, or it hurts to swallow.  You have wheezing or a persistent cough.  Your symptoms do not improve with treatment.  You have a hoarse voice.  Get help right away if:  You have pain in your arms, neck, jaw, teeth, or back.  You feel sweaty, dizzy, or light-headed.  You have chest pain or shortness of breath.  You vomit and your vomit looks like blood or coffee grounds.  You faint.  Your stool is bloody or black.  You cannot swallow, drink, or eat. This information is not intended to replace advice given to you by your health care provider. Make sure you discuss any questions you have with your health care provider. Document Released: 12/04/2004 Document Revised: 07/25/2015 Document Reviewed: 06/21/2014 Elsevier Interactive Patient Education  2017 Elsevier Inc.  Pantoprazole tablets What is this medicine? PANTOPRAZOLE (pan TOE  pra zole) prevents the production of acid in the stomach. It is used to treat gastroesophageal reflux disease (GERD), inflammation of the esophagus, and Zollinger-Ellison syndrome. This medicine may be used for other purposes; ask your health care provider or pharmacist if you have questions. COMMON BRAND NAME(S): Protonix What should I tell my health care provider before I take this medicine? They need to know if you have any of these conditions: -liver disease -low levels of magnesium in the blood -lupus -an unusual or allergic reaction to omeprazole, lansoprazole, pantoprazole, rabeprazole, other medicines, foods, dyes, or preservatives -pregnant or trying to get pregnant -breast-feeding How should I use this medicine? Take this medicine by mouth. Swallow the tablets whole with a drink of water. Follow the directions on the prescription label. Do not crush, break, or chew. Take your medicine at regular intervals. Do not take your medicine more often than directed. Talk to your pediatrician regarding the use of this medicine in children. While this drug may be prescribed for children as young as 5 years for selected conditions, precautions do apply. Overdosage: If you think you have taken too much of this medicine contact a poison control center or emergency room at once. NOTE: This medicine is only for you. Do not share this medicine with others. What if I miss a dose? If you miss a dose, take it as soon as you can. If it is almost time for your next dose, take only that dose. Do not take double or extra doses. What may interact with this medicine? Do not take this medicine with any of the following medications: -atazanavir -nelfinavir This medicine may also interact with the following medications: -ampicillin -delavirdine -erlotinib -iron salts -medicines for fungal infections like ketoconazole, itraconazole and voriconazole -methotrexate -mycophenolate mofetil -warfarin This list  may not describe all possible interactions. Give your health care provider a list of all the medicines, herbs, non-prescription drugs, or dietary supplements you use. Also tell them if you smoke, drink alcohol, or use illegal drugs. Some items may interact with your medicine. What should I watch for while using this medicine? It can take several days before your stomach pain gets better. Check with your doctor or health care professional if your condition does not start to get better, or if it gets worse. You may need blood work done while you are taking this medicine. What side effects may I notice from receiving this medicine? Side effects that you should report to your doctor or health care professional as soon as possible: -allergic reactions like skin rash, itching or hives, swelling of the face, lips, or tongue -bone, muscle or joint pain -breathing problems -chest pain or chest tightness -dark yellow or brown urine -dizziness -fast, irregular heartbeat -feeling faint or lightheaded -fever or sore throat -muscle spasm -palpitations -rash  on cheeks or arms that gets worse in the sun -redness, blistering, peeling or loosening of the skin, including inside the mouth -seizures -tremors -unusual bleeding or bruising -unusually weak or tired -yellowing of the eyes or skin Side effects that usually do not require medical attention (report to your doctor or health care professional if they continue or are bothersome): -constipation -diarrhea -dry mouth -headache -nausea This list may not describe all possible side effects. Call your doctor for medical advice about side effects. You may report side effects to FDA at 1-800-FDA-1088. Where should I keep my medicine? Keep out of the reach of children. Store at room temperature between 15 and 30 degrees C (59 and 86 degrees F). Protect from light and moisture. Throw away any unused medicine after the expiration date. NOTE: This sheet is a  summary. It may not cover all possible information. If you have questions about this medicine, talk to your doctor, pharmacist, or health care provider.  2018 Elsevier/Gold Standard (2015-03-29 12:20:19)

## 2016-07-21 NOTE — H&P (Signed)
@  PYKD@   Primary Care Physician:  Sinda Du, MD Primary Gastroenterologist:  Dr. Suella Broad  Pre-Procedure History & Physical: HPI:  Maria Kirby is a 56 y.o. female here for further evaluation of epigastric pain and dysphagia. Now on Protonix 40 mg daily.  Past Medical History:  Diagnosis Date  . Anemia   . Breast cancer ([name])    left breast cancer  . GERD (gastroesophageal reflux disease)   . Personal history of radiation therapy     Past Surgical History:  Procedure Laterality Date  . ABDOMINAL HYSTERECTOMY    . COLONOSCOPY    . COLONOSCOPY N/A 11/07/2015   Dr. Gala Romney: two 4 mm polyps in one 9 mm removed from the splenic flexure, one tubular adenoma. Colonoscopy in August 2022.   Marland Kitchen EXPLORATORY LAPAROTOMY      Prior to Admission medications   Medication Sig Start Date End Date Taking? Authorizing Provider  pantoprazole (PROTONIX) 40 MG tablet Take 40 mg by mouth daily.   Yes [provider]    Allergies as of 06/13/2016 - Review Complete 06/13/2016  Allergen Reaction Noted  . Dye fdc red [red dye]  02/15/2014  . Ivp dye [iodinated diagnostic agents]  06/13/2016    Family History  Problem Relation Age of Onset  . Diabetes Mother   . Hypertension Mother   . Cancer Father 46       colon  . Colon cancer Father     Social History   Social History  . Marital status: Single    Spouse name: N/A  . Number of children: N/A  . Years of education: N/A   Occupational History  . Not on file.   Social History Main Topics  . Smoking status: Current Every Day Smoker    Packs/day: 0.50    Years: 30.00    Types: Cigarettes  . Smokeless tobacco: Never Used  . Alcohol use No  . Drug use: No  . Sexual activity: Not Currently   Other Topics Concern  . Not on file   Social History Narrative  . No narrative on file    Review of Systems: See HPI, otherwise negative ROS  Physical Exam: Pulse 62   Temp 98.2 F (36.8 C) (Oral)   Resp 20   SpO2 97%   General:   Alert,  Well-developed, well-nourished, pleasant and cooperative in NAD Neck:  Supple; no masses or thyromegaly. No significant cervical adenopathy. Lungs:  Clear throughout to auscultation.   No wheezes, crackles, or rhonchi. No acute distress. Heart:  Regular rate and rhythm; no murmurs, clicks, rubs,  or gallops. Abdomen: Non-distended, normal bowel sounds.  Soft and nontender without appreciable mass or hepatosplenomegaly.  Pulses:  Normal pulses noted. Extremities:  Without clubbing or edema.  Impression:  Pleasant 56 year old lady with epigastric pain and esophageal dysphagia. Seen in office previously. Plans are for EGD with EGD as feasible/programmer. This again has been off to the patient today.  The risks, benefits, limitations, alternatives and imponderables have been reviewed with the patient. Potential for esophageal dilation, biopsy, etc. have also been reviewed.  Questions have been answered. All parties agreeable.     Notice: This dictation was prepared with Dragon dictation along with smaller phrase technology. Any transcriptional errors that result from this process are unintentional and may not be corrected upon review.

## 2016-07-21 NOTE — Op Note (Signed)
[facility] Patient Name: Maria Kirby Procedure Date: 07/21/2016 7:20 AM MRN: [id number] Date of Birth: 08-16-1960 Attending MD: Norvel Richards , MD CSN: [id number] Age: 56 Admit Type: Outpatient Procedure:                Upper GI endoscopy Indications:              Epigastric abdominal pain; dysphagia Providers:                Norvel Richards, MD, Jeanann Lewandowsky. Sharon Seller, RN Referring MD:             Jasper Loser. Luan Pulling MD, MD Medicines:                Propofol per Anesthesia Complications:            No immediate complications. Estimated Blood Loss:     Estimated blood loss was minimal. Procedure:                Pre-Anesthesia Assessment:                           - Prior to the procedure, a History and Physical                            was performed, and patient medications and                            allergies were reviewed. The patient's tolerance of                            previous anesthesia was also reviewed. The risks                            and benefits of the procedure and the sedation                            options and risks were discussed with the patient.                            All questions were answered, and informed consent                            was obtained. Prior Anticoagulants: The patient has                            taken no previous anticoagulant or antiplatelet                            agents. ASA Grade Assessment: II - A patient with                            mild systemic disease. After reviewing the risks                            and benefits, the patient was deemed in  satisfactory condition to undergo the procedure.                           After obtaining informed consent, the endoscope was                            passed under direct vision. Throughout the                            procedure, the patient's blood pressure, pulse, and                            oxygen saturations were  monitored continuously. The                            EG-[id number] ([id number]) scope was introduced through the                            and advanced to the second part of duodenum. The                            upper GI endoscopy was accomplished without                            difficulty. The patient tolerated the procedure                            well. Scope In: 7:44:33 AM Scope Out: 7:50:39 AM Total Procedure Duration: 0 hours 6 minutes 6 seconds  Findings:      The examined esophagus was normal. The scope was withdrawn. Dilation was       performed with a Maloney dilator with mild resistance at 33 Fr. The       dilation site was examined and showed. superficial tear through the UES       mucosa.      The entire examined stomach was normal.      The duodenal bulb and second portion of the duodenum were normal. Impression:               - Normal esophagus. Dilated.                           - Normal stomach.                           - Normal duodenal bulb and second portion of the                            duodenum.                           - No specimens collected. Moderate Sedation:      Moderate (conscious) sedation was personally administered by an       anesthesia professional. The following parameters were monitored: oxygen       saturation, heart rate, blood pressure, respiratory rate, EKG, adequacy       of  pulmonary ventilation, and response to care. Total physician       intraservice time was 12 minutes. Recommendation:           - Patient has a contact number available for                            emergencies. The signs and symptoms of potential                            delayed complications were discussed with the                            patient. Return to normal activities tomorrow.                            Written discharge instructions were provided to the                            patient.                           - Resume previous diet.                            - Continue present medications.                           - No repeat upper endoscopy.                           - Return to GI office in 3 months. Procedure Code(s):        --- Professional ---                           [phone], Esophagogastroduodenoscopy, flexible,                            transoral; diagnostic, including collection of                            specimen(s) by brushing or washing, when performed                            (separate procedure)                           43450, Dilation of esophagus, by unguided sound or                            bougie, single or multiple passes Diagnosis Code(s):        --- Professional ---                           R10.13, Epigastric pain CPT copyright 2016 American Medical Association. All rights reserved. The codes documented in this report are preliminary and upon coder review may  be revised to meet current compliance requirements. Herbie Baltimore  Hilton Cork, MD Norvel Richards, MD 07/21/2016 8:04:32 AM This report has been signed electronically. Number of Addenda: 0

## 2016-07-21 NOTE — Anesthesia Procedure Notes (Signed)
Procedure Name: MAC Date/Time: 07/21/2016 7:35 AM Performed by: Vista Deck Pre-anesthesia Checklist: Patient identified, Emergency Drugs available, Suction available, Timeout performed and Patient being monitored Patient Re-evaluated:Patient Re-evaluated prior to inductionOxygen Delivery Method: Non-rebreather mask

## 2016-07-21 NOTE — Anesthesia Preprocedure Evaluation (Signed)
Anesthesia Evaluation  Patient identified by MRN, date of birth, ID band Patient awake    Reviewed: Allergy & Precautions, NPO status , Patient's Chart, lab work & pertinent test results  Airway Mallampati: I  TM Distance: >3 FB Neck ROM: Full    Dental  (+) Edentulous Upper   Pulmonary Current Smoker,    breath sounds clear to auscultation       Cardiovascular negative cardio ROS   Rhythm:Regular Rate:Normal     Neuro/Psych negative neurological ROS  negative psych ROS   GI/Hepatic GERD  Medicated,  Endo/Other    Renal/GU      Musculoskeletal   Abdominal   Peds  Hematology  (+) anemia ,   Anesthesia Other Findings L Breast CA, s/p radiation  Reproductive/Obstetrics                             Anesthesia Physical Anesthesia Plan  ASA: III  Anesthesia Plan: MAC   Post-op Pain Management:    Induction: Intravenous  Airway Management Planned: Simple Face Mask  Additional Equipment:   Intra-op Plan:   Post-operative Plan:   Informed Consent: I have reviewed the patients History and Physical, chart, labs and discussed the procedure including the risks, benefits and alternatives for the proposed anesthesia with the patient or authorized representative who has indicated his/her understanding and acceptance.     Plan Discussed with:   Anesthesia Plan Comments:         Anesthesia Quick Evaluation

## 2016-07-21 NOTE — Anesthesia Postprocedure Evaluation (Signed)
Anesthesia Post Note  Patient: Maria Kirby  Procedure(s) Performed: Procedure(s) (LRB): ESOPHAGOGASTRODUODENOSCOPY (EGD) WITH PROPOFOL (N/A) MALONEY DILATION (N/A)  Patient location during evaluation: Short Stay Anesthesia Type: MAC Level of consciousness: awake and alert Pain management: satisfactory to patient Vital Signs Assessment: post-procedure vital signs reviewed and stable Respiratory status: spontaneous breathing Cardiovascular status: stable Anesthetic complications: no     Last Vitals:  Vitals:   07/21/16 0815 07/21/16 0830  BP: 123/67 114/85  Pulse: 69 62  Resp: 16 19  Temp:      Last Pain:  Vitals:   07/21/16 0637  TempSrc: Oral                 Maria Kirby

## 2016-07-21 NOTE — Transfer of Care (Signed)
Immediate Anesthesia Transfer of Care Note  Patient: Maria Kirby  Procedure(s) Performed: Procedure(s) with comments: ESOPHAGOGASTRODUODENOSCOPY (EGD) WITH PROPOFOL (N/A) - 7:30am MALONEY DILATION (N/A)  Patient Location: PACU  Anesthesia Type:MAC  Level of Consciousness: awake and patient cooperative  Airway & Oxygen Therapy: Patient Spontanous Breathing and non-rebreather face mask  Post-op Assessment: Report given to RN and Post -op Vital signs reviewed and stable  Post vital signs: Reviewed and stable  Last Vitals:  Vitals:   07/21/16 0725 07/21/16 0730  BP:  124/70  Pulse:    Resp: (!) 37 19  Temp:      Last Pain:  Vitals:   07/21/16 0637  TempSrc: Oral      Patients Stated Pain Goal: 4 (29/24/46 2863)  Complications: No apparent anesthesia complications

## 2016-07-25 ENCOUNTER — Encounter (HOSPITAL_COMMUNITY): Payer: Self-pay | Admitting: Internal Medicine

## 2016-07-30 ENCOUNTER — Other Ambulatory Visit (HOSPITAL_COMMUNITY): Payer: Self-pay | Admitting: Pulmonary Disease

## 2016-07-30 ENCOUNTER — Ambulatory Visit (HOSPITAL_COMMUNITY)
Admission: RE | Admit: 2016-07-30 | Discharge: 2016-07-30 | Disposition: A | Discharge status: Home / Self Care | Payer: Commercial Managed Care - PPO | Source: Ambulatory Visit | Attending: Pulmonary Disease | Admitting: Pulmonary Disease

## 2016-07-30 DIAGNOSIS — R52 Pain, unspecified: Secondary | ICD-10-CM | POA: Diagnosis not present

## 2016-07-30 DIAGNOSIS — M545 Low back pain: Secondary | ICD-10-CM | POA: Diagnosis not present

## 2016-07-30 DIAGNOSIS — M47814 Spondylosis without myelopathy or radiculopathy, thoracic region: Secondary | ICD-10-CM | POA: Diagnosis not present

## 2016-07-30 DIAGNOSIS — K210 Gastro-esophageal reflux disease with esophagitis: Secondary | ICD-10-CM | POA: Diagnosis not present

## 2016-07-30 DIAGNOSIS — M546 Pain in thoracic spine: Secondary | ICD-10-CM | POA: Diagnosis not present

## 2016-07-30 DIAGNOSIS — M5134 Other intervertebral disc degeneration, thoracic region: Secondary | ICD-10-CM | POA: Insufficient documentation

## 2016-08-19 ENCOUNTER — Ambulatory Visit (HOSPITAL_COMMUNITY)
Admission: RE | Admit: 2016-08-19 | Discharge: 2016-08-19 | Disposition: A | Discharge status: Home / Self Care | Payer: Commercial Managed Care - PPO | Source: Ambulatory Visit | Attending: Pulmonary Disease | Admitting: Pulmonary Disease

## 2016-08-19 ENCOUNTER — Other Ambulatory Visit (HOSPITAL_COMMUNITY): Payer: Self-pay | Admitting: Pulmonary Disease

## 2016-08-19 DIAGNOSIS — M545 Low back pain: Secondary | ICD-10-CM

## 2016-08-19 DIAGNOSIS — M549 Dorsalgia, unspecified: Secondary | ICD-10-CM

## 2016-08-19 DIAGNOSIS — M47816 Spondylosis without myelopathy or radiculopathy, lumbar region: Secondary | ICD-10-CM | POA: Diagnosis not present

## 2016-08-19 DIAGNOSIS — I708 Atherosclerosis of other arteries: Secondary | ICD-10-CM | POA: Insufficient documentation

## 2016-08-19 DIAGNOSIS — M546 Pain in thoracic spine: Secondary | ICD-10-CM | POA: Diagnosis present

## 2016-08-26 DIAGNOSIS — L403 Pustulosis palmaris et plantaris: Secondary | ICD-10-CM | POA: Diagnosis not present

## 2016-09-08 DIAGNOSIS — M545 Low back pain: Secondary | ICD-10-CM | POA: Diagnosis not present

## 2016-09-08 DIAGNOSIS — K210 Gastro-esophageal reflux disease with esophagitis: Secondary | ICD-10-CM | POA: Diagnosis not present

## 2016-09-25 ENCOUNTER — Ambulatory Visit (HOSPITAL_COMMUNITY): Payer: Commercial Managed Care - PPO | Attending: Pulmonary Disease | Admitting: Physical Therapy

## 2016-09-25 DIAGNOSIS — G8929 Other chronic pain: Secondary | ICD-10-CM

## 2016-09-25 DIAGNOSIS — M62830 Muscle spasm of back: Secondary | ICD-10-CM

## 2016-09-25 DIAGNOSIS — M546 Pain in thoracic spine: Secondary | ICD-10-CM | POA: Diagnosis present

## 2016-09-25 NOTE — Patient Instructions (Signed)
  74 cat old horse  POE  Press up

## 2016-09-25 NOTE — Therapy (Signed)
[place], [place], [address] Phone: [phone]   Fax:  [phone]  Physical Therapy Evaluation  Patient Details  Name: Maria Kirby MRN: [id number] Date of Birth: 10-16-1960 Referring Provider: Sinda Du   Encounter Date: 09/25/2016      PT End of Session - 09/25/16 1114    Visit Number 1   Number of Visits 8   Date for PT Re-Evaluation 10/25/16   Authorization Type [employer]   Authorization - Visit Number 1   Authorization - Number of Visits 8   PT Start Time 4656   PT Stop Time 1110   PT Time Calculation (min) 35 min   Activity Tolerance Patient tolerated treatment well   Behavior During Therapy [facility] for tasks assessed/performed      Past Medical History:  Diagnosis Date  . Anemia   . Breast cancer ([place])    left breast cancer  . GERD (gastroesophageal reflux disease)   . Personal history of radiation therapy     Past Surgical History:  Procedure Laterality Date  . ABDOMINAL HYSTERECTOMY    . COLONOSCOPY    . COLONOSCOPY N/A 11/07/2015   Dr. Gala Romney: two 4 mm polyps in one 9 mm removed from the splenic flexure, one tubular adenoma. Colonoscopy in August 2022.   . ESOPHAGOGASTRODUODENOSCOPY (EGD) WITH PROPOFOL N/A 07/21/2016   Procedure: ESOPHAGOGASTRODUODENOSCOPY (EGD) WITH PROPOFOL;  Surgeon: Daneil Dolin, MD;  [facility];  Service: Endoscopy;  Laterality: N/A;  7:30am  . EXPLORATORY LAPAROTOMY    . MALONEY DILATION N/A 07/21/2016   Procedure: Venia Minks DILATION;  Surgeon: Daneil Dolin, MD;  [facility];  Service: Endoscopy;  Laterality: N/A;    There were no vitals filed for this visit.       Subjective Assessment - 09/25/16 1035    Subjective Maria Kirby states that she began having pin in her back on 05/16/2016 that began to radiate into her both sides  of her  Ribs. .  She  states that there was no injury or trauma.  She went to the ER with no  significant findings.  Tests have been completed on her GI, (she was dx with acid reflux), she had a  cardiac workup which was (-) .The pain continued.  She now gets occasional pain maybe 2x a day that varies in time and intensity.  She states that she has increased pain when she goes to bed and has difficulty getting to sleep.  She feels that whatever happened it is taking care of itself.    Pertinent History unremarkable    How long can you sit comfortably? When it happened she had increased pain when she sat down;  No limitations now    How long can you stand comfortably? no limitations    How long can you walk comfortably? no limitations    Patient Stated Goals get rid of the pain    Currently in Pain? Yes   Pain Score 2 will go up to a 7 at night    Pain Location Rib cage   Pain Orientation Left   Pain Descriptors / Indicators Burning   Pain Type Chronic pain   Pain Radiating Towards rib area    Pain Onset More than a month ago   Pain Frequency Intermittent   Aggravating Factors  lying on back or stomach pt works through it    Pain Relieving Factors nothing pt just works  through it             [facility] PT Assessment - 09/25/16 0001      Assessment   Medical Diagnosis radicular thoracic pain B    Referring Provider Sinda Du    Next MD Visit 10/21/2016   Prior Therapy none     Precautions   Precautions None     Restrictions   Weight Bearing Restrictions No     Balance Screen   Has the patient fallen in the past 6 months No   Has the patient had a decrease in activity level because of a fear of falling?  No   Is the patient reluctant to leave their home because of a fear of falling?  No     Home Ec[employer] residence     Prior Function   Level of Independence Independent   Vocation Full time employment   Vocation Requirements standing, driving and light lifting      Cognition   Overall Cognitive Status Within Functional Limits for  tasks assessed     Observation/Other Assessments   Focus on Therapeutic Outcomes (FOTO)  68     Posture/Postural Control   Posture/Postural Control Postural limitations   Postural Limitations Decreased thoracic kyphosis     ROM / Strength   AROM / PROM / Strength AROM;Strength     AROM   Overall AROM Comments ROM wfl    AROM Assessment Site Thoracic   Thoracic Flexion no change of sx with reps    Thoracic Extension with dowel to promote extesion reps no change      Palpation   Palpation comment mm spasms paraspinal mm thoracic area,            Objective measurements completed on examination: See above findings.          [place] Adult PT Treatment/Exercise - 09/25/16 0001      Exercises   Exercises Lumbar     Lumbar Exercises: Stretches   Prone on Elbows Stretch 2 reps;30 seconds   Press Ups 5 reps     Lumbar Exercises: Standing   Other Standing Lumbar Exercises dowel in thoracic region extend x 10     Lumbar Exercises: Quadruped   Madcat/Old Horse 5 reps     Manual Therapy   Manual Therapy Soft tissue mobilization   Manual therapy comments done seperate from all other aspects of treatment    Soft tissue mobilization efflurage, petrissage and rolling                 PT Education - 09/25/16 1126    Education provided Yes   Education Details HEP   Person(s) Educated Patient   Methods Explanation   Comprehension Verbalized understanding          PT Short Term Goals - 09/25/16 1121      PT SHORT TERM GOAL #1   Title Pt to have no more radicular sx to demonstrate decreased nerve irritation.    Time 2   Period Weeks   Status New     PT SHORT TERM GOAL #2   Title Pt to be able to verbalize and demonstrate proper body mechanics for lifting to decrease stress on thoracic and lumbar region.    Time 2   Period Weeks   Status New     PT SHORT TERM GOAL #3   Title Pain in pt thoracic area  to be no greater than a 4/10 at  night to improve pt  sleeping    Time 2   Period Weeks   Status New           PT Long Term Goals - 09/25/16 1123      PT LONG TERM GOAL #1   Title Pt to state that she has not had any pain in her thoracic area  in the past week to allow pt to have a full night sleep    Time 4   Period Weeks   Status New     PT LONG TERM GOAL #2   Title Pt to be I in Hep to control sx of pain    Time 4   Period Weeks   Status New     PT LONG TERM GOAL #3   Title Pt to be able to complete lifting at work without having any increased back or rib pain.    Time 4   Period Weeks                Plan - 09/25/16 1115    Clinical Impression Statement Maria Kirby is a 56 yo female who had significant increased back pain on 05/16/2016 that radiated into B ribcage.  There was no injury or trauma associated with the onset of pain.  She has been cleared GI and cardiac wise and is now being referred to skilled therapy.  Pt states overall symptoms are resolving but she is still havng significant pain , 7/10, everynight which is making it difficulty for her to sleep.  Examination demonstrates decrease thoracic kyphosis, increased mm spasm B thoracic area and increased pain affecting pt sleep.  Maria Kirby will benefit from skilled physical therapy to address these issues.   Clinical Presentation Stable   Clinical Decision Making Moderate   Rehab Potential Good   PT Frequency 2x / week   PT Duration 4 weeks   PT Treatment/Interventions ADLs/Self Care Home Management;Therapeutic activities;Therapeutic exercise;Patient/family education;Manual techniques;Ultrasound;Electrical Stimulation;Other (comment)  Laser    PT Next Visit Plan Begin cybex lat pull down as well as cybex chest press and rows.  Manual to thoracic area B with gentle jt mobilization. Education on Economist.    PT Home Exercise Plan mad cat old horse, POE and press up.       Patient will benefit from skilled therapeutic intervention in order to improve the  following deficits and impairments:  Decreased activity tolerance, Pain, Postural dysfunction  Visit Diagnosis: Muscle spasm of back  Chronic bilateral thoracic back pain     Problem List Patient Active Problem List   Diagnosis Date Noted  . Abdominal pain, epigastric 06/13/2016  . Esophageal dysphagia 06/13/2016    Rayetta Humphrey, PT CLT [phone] 09/25/2016, 11:29 AM  [place] [address] [place], [place], [address] Phone: [phone]   Fax:  [phone]  Name: Maria Kirby MRN: [id number] Date of Birth: [birth year]

## 2016-09-30 ENCOUNTER — Telehealth (HOSPITAL_COMMUNITY): Payer: Self-pay | Admitting: Pulmonary Disease

## 2016-09-30 ENCOUNTER — Ambulatory Visit (HOSPITAL_COMMUNITY): Payer: Commercial Managed Care - PPO | Admitting: Physical Therapy

## 2016-09-30 NOTE — Telephone Encounter (Signed)
7//18  pt left a message that she has been called in to work

## 2016-10-02 ENCOUNTER — Ambulatory Visit (HOSPITAL_COMMUNITY): Payer: Commercial Managed Care - PPO | Admitting: Physical Therapy

## 2016-10-02 DIAGNOSIS — M546 Pain in thoracic spine: Secondary | ICD-10-CM

## 2016-10-02 DIAGNOSIS — G8929 Other chronic pain: Secondary | ICD-10-CM

## 2016-10-02 DIAGNOSIS — M62830 Muscle spasm of back: Secondary | ICD-10-CM

## 2016-10-02 NOTE — Therapy (Signed)
[place], [place], [address] Phone: [phone]   Fax:  [phone]  Physical Therapy Treatment  Patient Details  Name: Maria Kirby MRN: [id number] Date of Birth: Jul 03, 1960 Referring Provider: Sinda Du   Encounter Date: 10/02/2016      PT End of Session - 10/02/16 0900    Visit Number 2   Number of Visits 8   Date for PT Re-Evaluation 10/25/16   Authorization Type [employer]   Authorization - Visit Number 2   Authorization - Number of Visits 8   PT Start Time 0818   PT Stop Time 0858   PT Time Calculation (min) 40 min   Activity Tolerance Patient tolerated treatment well   Behavior During Therapy [facility] for tasks assessed/performed      Past Medical History:  Diagnosis Date  . Anemia   . Breast cancer ([place])    left breast cancer  . GERD (gastroesophageal reflux disease)   . Personal history of radiation therapy     Past Surgical History:  Procedure Laterality Date  . ABDOMINAL HYSTERECTOMY    . COLONOSCOPY    . COLONOSCOPY N/A 11/07/2015   Dr. Gala Romney: two 4 mm polyps in one 9 mm removed from the splenic flexure, one tubular adenoma. Colonoscopy in August 2022.   . ESOPHAGOGASTRODUODENOSCOPY (EGD) WITH PROPOFOL N/A 07/21/2016   Procedure: ESOPHAGOGASTRODUODENOSCOPY (EGD) WITH PROPOFOL;  Surgeon: Daneil Dolin, MD;  [facility];  Service: Endoscopy;  Laterality: N/A;  7:30am  . EXPLORATORY LAPAROTOMY    . MALONEY DILATION N/A 07/21/2016   Procedure: Venia Minks DILATION;  Surgeon: Daneil Dolin, MD;  [facility];  Service: Endoscopy;  Laterality: N/A;    There were no vitals filed for this visit.      Subjective Assessment - 10/02/16 0820    Subjective Maria Kirby states that she is unable to do the POE exercise due to having two bad vertebrae in her neck and the exercise aggrevates this   Pertinent History unremarkable    How long can you sit comfortably? When it  happened she had increased pain when she sat down;  No limitations now    How long can you stand comfortably? no limitations    How long can you walk comfortably? no limitations    Patient Stated Goals get rid of the pain    Currently in Pain? No/denies   Pain Onset More than a month ago                         [facility] Adult PT Treatment/Exercise - 10/02/16 0001      Posture/Postural Control   Posture/Postural Control Postural limitations   Postural Limitations Decreased thoracic kyphosis     Exercises   Exercises Lumbar     Lumbar Exercises: Stretches   Prone on Elbows Stretch --   Press Ups --     Lumbar Exercises: Standing   Other Standing Lumbar Exercises dowel in thoracic region extend x 10     Lumbar Exercises: Seated   Other Seated Lumbar Exercises cybex chest press 2:pl x 10; rows 1 Pl x 10      Lumbar Exercises: Prone   Single Arm Raise 10 reps   Other Prone Lumbar Exercises rows x 10      Lumbar Exercises: Quadruped   Madcat/Old Horse 5 reps     Manual Therapy   Manual Therapy Joint  mobilization;Soft tissue mobilization   Manual therapy comments done seperate from all other aspects of treatment    Joint Mobilization Thoracic vertebrae to improve movement    Soft tissue mobilization efflurage, petrissage and rolling                 PT Education - 10/02/16 0902    Education provided Yes   Education Details Pt may be sore after manual and to increase water intake.    Person(s) Educated Patient   Methods Explanation   Comprehension Verbalized understanding          PT Short Term Goals - 09/25/16 1121      PT SHORT TERM GOAL #1   Title Pt to have no more radicular sx to demonstrate decreased nerve irritation.    Time 2   Period Weeks   Status New     PT SHORT TERM GOAL #2   Title Pt to be able to verbalize and demonstrate proper body mechanics for lifting to decrease stress on thoracic and lumbar region.    Time 2   Period  Weeks   Status New     PT SHORT TERM GOAL #3   Title Pain in pt thoracic area  to be no greater than a 4/10 at night to improve pt sleeping    Time 2   Period Weeks   Status New           PT Long Term Goals - 09/25/16 1123      PT LONG TERM GOAL #1   Title Pt to state that she has not had any pain in her thoracic area  in the past week to allow pt to have a full night sleep    Time 4   Period Weeks   Status New     PT LONG TERM GOAL #2   Title Pt to be I in Hep to control sx of pain    Time 4   Period Weeks   Status New     PT LONG TERM GOAL #3   Title Pt to be able to complete lifting at work without having any increased back or rib pain.    Time 4   Period Weeks               Plan - 10/02/16 0900    Clinical Impression Statement Evaluation and goals reviewed with pt.  Pt still has multiple paravertebral mm spasms in mid thoracic area with noted swelling on the Rt side.  Noted improvement after manual.    Rehab Potential Good   PT Frequency 2x / week   PT Duration 4 weeks   PT Treatment/Interventions ADLs/Self Care Home Management;Therapeutic activities;Therapeutic exercise;Patient/family education;Manual techniques;Ultrasound;Electrical Stimulation;Other (comment)  Laser    PT Next Visit Plan Stop POE due to increase cervical pain.  Begin cybex lat pull down.  Continue   Manual to thoracic area B with gentle jt mobilization.    PT Home Exercise Plan mad cat old horse, POE and press up.       Patient will benefit from skilled therapeutic intervention in order to improve the following deficits and impairments:  Decreased activity tolerance, Pain, Postural dysfunction  Visit Diagnosis: Muscle spasm of back  Chronic bilateral thoracic back pain     Problem List Patient Active Problem List   Diagnosis Date Noted  . Abdominal pain, epigastric 06/13/2016  . Esophageal dysphagia 06/13/2016    Maria Kirby, PT CLT [phone] 10/02/2016, 9:03  AM  [place], [place], [address] Phone: [phone]   Fax:  [phone]  Name: Maria Kirby MRN: [id number] Date of Birth: 05/30/1960

## 2016-10-07 ENCOUNTER — Ambulatory Visit (HOSPITAL_COMMUNITY): Payer: Commercial Managed Care - PPO

## 2016-10-07 DIAGNOSIS — M546 Pain in thoracic spine: Principal | ICD-10-CM

## 2016-10-07 DIAGNOSIS — G8929 Other chronic pain: Secondary | ICD-10-CM

## 2016-10-07 DIAGNOSIS — M62830 Muscle spasm of back: Secondary | ICD-10-CM

## 2016-10-07 DIAGNOSIS — L403 Pustulosis palmaris et plantaris: Secondary | ICD-10-CM | POA: Diagnosis not present

## 2016-10-07 NOTE — Therapy (Signed)
[place], [place], [address] Phone: [phone]   Fax:  [phone]  Physical Therapy Treatment  Patient Details  Name: Maria Kirby MRN: [id number] Date of Birth: [birth year] Referring Provider: Sinda Du   Encounter Date: 10/07/2016      PT End of Session - 10/07/16 1042    Visit Number 3   Number of Visits 8   Date for PT Re-Evaluation 10/25/16   Authorization Type [employer]   Authorization - Visit Number 3   Authorization - Number of Visits 8   PT Start Time 6599   PT Stop Time 1113   PT Time Calculation (min) 38 min   Activity Tolerance Patient tolerated treatment well   Behavior During Therapy [facility] for tasks assessed/performed      Past Medical History:  Diagnosis Date  . Anemia   . Breast cancer ([place])    left breast cancer  . GERD (gastroesophageal reflux disease)   . Personal history of radiation therapy     Past Surgical History:  Procedure Laterality Date  . ABDOMINAL HYSTERECTOMY    . COLONOSCOPY    . COLONOSCOPY N/A 11/07/2015   Dr. Gala Romney: two 4 mm polyps in one 9 mm removed from the splenic flexure, one tubular adenoma. Colonoscopy in August 2022.   . ESOPHAGOGASTRODUODENOSCOPY (EGD) WITH PROPOFOL N/A 07/21/2016   Procedure: ESOPHAGOGASTRODUODENOSCOPY (EGD) WITH PROPOFOL;  Surgeon: Daneil Dolin, MD;  [facility];  Service: Endoscopy;  Laterality: N/A;  7:30am  . EXPLORATORY LAPAROTOMY    . MALONEY DILATION N/A 07/21/2016   Procedure: Venia Minks DILATION;  Surgeon: Daneil Dolin, MD;  [facility];  Service: Endoscopy;  Laterality: N/A;    There were no vitals filed for this visit.      Subjective Assessment - 10/07/16 1040    Subjective Pt reports some pain on Lt ribs and center of mid back, pain scale 2/10 today.  Reports compliance wiht HEP.     Pertinent History unremarkable    Patient Stated Goals get rid of the pain    Currently in Pain?  Yes   Pain Score 2    Pain Location Back   Pain Orientation Left;Medial   Pain Descriptors / Indicators Aching   Pain Type Chronic pain   Pain Onset More than a month ago   Pain Frequency Intermittent   Aggravating Factors  lying on back or stomach pt works through it   Pain Relieving Factors nothing pt just works through it                          [facility] Adult PT Treatment/Exercise - 10/07/16 0001      Lumbar Exercises: Machines for Strengthening   Other Lumbar Machine Exercise Cybex chest press and row 2PL 15x; cybex lat pull down 8PL 15x     Lumbar Exercises: Prone   Single Arm Raise 10 reps   Other Prone Lumbar Exercises rows x 10    Other Prone Lumbar Exercises Shoulder extension 10x     Lumbar Exercises: Quadruped   Madcat/Old Horse 5 reps     Manual Therapy   Manual Therapy Soft tissue mobilization   Manual therapy comments done seperate from all other aspects of treatment    Soft tissue mobilization efflurage, petrissage and rolling                   PT  Short Term Goals - 09/25/16 1121      PT SHORT TERM GOAL #1   Title Pt to have no more radicular sx to demonstrate decreased nerve irritation.    Time 2   Period Weeks   Status New     PT SHORT TERM GOAL #2   Title Pt to be able to verbalize and demonstrate proper body mechanics for lifting to decrease stress on thoracic and lumbar region.    Time 2   Period Weeks   Status New     PT SHORT TERM GOAL #3   Title Pain in pt thoracic area  to be no greater than a 4/10 at night to improve pt sleeping    Time 2   Period Weeks   Status New           PT Long Term Goals - 09/25/16 1123      PT LONG TERM GOAL #1   Title Pt to state that she has not had any pain in her thoracic area  in the past week to allow pt to have a full night sleep    Time 4   Period Weeks   Status New     PT LONG TERM GOAL #2   Title Pt to be I in Hep to control sx of pain    Time 4   Period Weeks    Status New     PT LONG TERM GOAL #3   Title Pt to be able to complete lifting at work without having any increased back or rib pain.    Time 4   Period Weeks               Plan - 10/07/16 1258    Clinical Impression Statement Added cybex lat pull down for postural strengthening.  Continued with manual to address multiple paravertebral and intercostal spasms in mid thoracic area for pain control.  Reports of relief at EOS.     Rehab Potential Good   PT Frequency 2x / week   PT Duration 4 weeks   PT Treatment/Interventions ADLs/Self Care Home Management;Therapeutic activities;Therapeutic exercise;Patient/family education;Manual techniques;Ultrasound;Electrical Stimulation;Other (comment)  Laser   PT Next Visit Plan Continue cybex postural strengthening.  Manual to thoracic area B with gentle jt mobilization.    PT Home Exercise Plan mad cat old horse, POE and press up.       Patient will benefit from skilled therapeutic intervention in order to improve the following deficits and impairments:  Decreased activity tolerance, Pain, Postural dysfunction  Visit Diagnosis: Chronic bilateral thoracic back pain  Muscle spasm of back     Problem List Patient Active Problem List   Diagnosis Date Noted  . Abdominal pain, epigastric 06/13/2016  . Esophageal dysphagia 06/13/2016   Ihor Austin, LPTA; [place]  Aldona Lento 10/07/2016, 1:45 PM  [place], [place], [address] Phone: [phone]   Fax:  [phone]  Name: Maria Kirby MRN: [id number] Date of Birth: 1961-03-04

## 2016-10-09 ENCOUNTER — Ambulatory Visit (HOSPITAL_COMMUNITY): Payer: Commercial Managed Care - PPO | Attending: Pulmonary Disease | Admitting: Physical Therapy

## 2016-10-09 DIAGNOSIS — G8929 Other chronic pain: Secondary | ICD-10-CM

## 2016-10-09 DIAGNOSIS — M62830 Muscle spasm of back: Secondary | ICD-10-CM | POA: Diagnosis present

## 2016-10-09 DIAGNOSIS — M546 Pain in thoracic spine: Secondary | ICD-10-CM | POA: Insufficient documentation

## 2016-10-09 NOTE — Therapy (Signed)
[place], [place], [address] Phone: [phone]   Fax:  [phone]  Physical Therapy Treatment  Patient Details  Name: Maria Kirby MRN: [id number] Date of Birth: 1961/01/17 Referring Provider: Sinda Du   Encounter Date: 10/09/2016      PT End of Session - 10/09/16 1127    Visit Number 4   Number of Visits 8   Date for PT Re-Evaluation 10/25/16   Authorization Type [employer]   Authorization - Visit Number 4   Authorization - Number of Visits 8   PT Start Time 0017  patient arrived late    PT Stop Time 1111   PT Time Calculation (min) 26 min   Activity Tolerance Patient tolerated treatment well   Behavior During Therapy [facility] for tasks assessed/performed      Past Medical History:  Diagnosis Date  . Anemia   . Breast cancer ([place])    left breast cancer  . GERD (gastroesophageal reflux disease)   . Personal history of radiation therapy     Past Surgical History:  Procedure Laterality Date  . ABDOMINAL HYSTERECTOMY    . COLONOSCOPY    . COLONOSCOPY N/A 11/07/2015   Dr. Gala Romney: two 4 mm polyps in one 9 mm removed from the splenic flexure, one tubular adenoma. Colonoscopy in August 2022.   . ESOPHAGOGASTRODUODENOSCOPY (EGD) WITH PROPOFOL N/A 07/21/2016   Procedure: ESOPHAGOGASTRODUODENOSCOPY (EGD) WITH PROPOFOL;  Surgeon: Daneil Dolin, MD;  [facility];  Service: Endoscopy;  Laterality: N/A;  7:30am  . EXPLORATORY LAPAROTOMY    . MALONEY DILATION N/A 07/21/2016   Procedure: Venia Minks DILATION;  Surgeon: Daneil Dolin, MD;  [facility];  Service: Endoscopy;  Laterality: N/A;    There were no vitals filed for this visit.      Subjective Assessment - 10/09/16 1043    Subjective Patient arrives stating that she is doing OK, the past couple days she has had quite a bit of rib tenderness the past couple days    Pertinent History unremarkable    Currently in Pain? Yes    Pain Score 3                          [facility] Adult PT Treatment/Exercise - 10/09/16 0001      Lumbar Exercises: Aerobic   UBE (Upper Arm Bike) x3 min forward, x3 min backward   not included in billing      Lumbar Exercises: Machines for Strengthening   Other Lumbar Machine Exercise Cybex chest press 2 pl x20     Lumbar Exercises: Seated   Other Seated Lumbar Exercises 3D thoracic excursions 1x15 all directions; cybex chest press 2 pl x20      Lumbar Exercises: Prone   Other Prone Lumbar Exercises prone Y/T/I x20     Manual Therapy   Manual Therapy Soft tissue mobilization;Joint mobilization   Manual therapy comments done seperate from all other aspects of treatment    Joint Mobilization thoracic PAs grade III x 2 rounds; rib head mobilizations B    Soft tissue mobilization ball assisted STM L thoracic paraspinals                 PT Education - 10/09/16 1127    Education provided No          PT Short Term Goals - 09/25/16 1121      PT SHORT TERM  GOAL #1   Title Pt to have no more radicular sx to demonstrate decreased nerve irritation.    Time 2   Period Weeks   Status New     PT SHORT TERM GOAL #2   Title Pt to be able to verbalize and demonstrate proper body mechanics for lifting to decrease stress on thoracic and lumbar region.    Time 2   Period Weeks   Status New     PT SHORT TERM GOAL #3   Title Pain in pt thoracic area  to be no greater than a 4/10 at night to improve pt sleeping    Time 2   Period Weeks   Status New           PT Long Term Goals - 09/25/16 1123      PT LONG TERM GOAL #1   Title Pt to state that she has not had any pain in her thoracic area  in the past week to allow pt to have a full night sleep    Time 4   Period Weeks   Status New     PT LONG TERM GOAL #2   Title Pt to be I in Hep to control sx of pain    Time 4   Period Weeks   Status New     PT LONG TERM GOAL #3   Title Pt to be able to  complete lifting at work without having any increased back or rib pain.    Time 4   Period Weeks               Plan - 10/09/16 1129    Clinical Impression Statement Continued with manual to thoracic spine, including thoracic PAs and rib mobilization and STM; otherwise performed thoracic mobility and postural strengthening this session. Patient continues to state that her pain seems to have a mind of its own, no interventions this session appeared to have impact on intensity or location of pain.    Rehab Potential Good   PT Frequency 2x / week   PT Duration 4 weeks   PT Treatment/Interventions ADLs/Self Care Home Management;Therapeutic activities;Therapeutic exercise;Patient/family education;Manual techniques;Ultrasound;Electrical Stimulation;Other (comment)   PT Next Visit Plan Continue cybex postural strengthening.  Manual to thoracic area B with gentle jt mobilization.    PT Home Exercise Plan mad cat old horse, POE and press up.       Patient will benefit from skilled therapeutic intervention in order to improve the following deficits and impairments:  Decreased activity tolerance, Pain, Postural dysfunction  Visit Diagnosis: Chronic bilateral thoracic back pain  Muscle spasm of back     Problem List Patient Active Problem List   Diagnosis Date Noted  . Abdominal pain, epigastric 06/13/2016  . Esophageal dysphagia 06/13/2016    Deniece Ree PT, DPT [facility] [address] [place], [place], [address] Phone: [phone]   Fax:  [phone]  Name: Maria Kirby MRN: [id number] Date of Birth: 12-14-1960

## 2016-10-14 ENCOUNTER — Ambulatory Visit (HOSPITAL_COMMUNITY): Payer: Commercial Managed Care - PPO

## 2016-10-14 DIAGNOSIS — M62830 Muscle spasm of back: Secondary | ICD-10-CM | POA: Diagnosis not present

## 2016-10-14 DIAGNOSIS — G8929 Other chronic pain: Secondary | ICD-10-CM

## 2016-10-14 DIAGNOSIS — M546 Pain in thoracic spine: Principal | ICD-10-CM

## 2016-10-14 NOTE — Therapy (Signed)
[place], [place], [address] Phone: [phone]   Fax:  [phone]  Physical Therapy Treatment  Patient Details  Name: Maria Kirby MRN: [id number] Date of Birth: Jul 27, 1960 Referring Provider: Sinda Du   Encounter Date: 10/14/2016      PT End of Session - 10/14/16 0958    Visit Number 5   Number of Visits 8   Date for PT Re-Evaluation 10/25/16   Authorization Type [employer]   Authorization - Visit Number 5   Authorization - Number of Visits 8   PT Start Time [phone]  late for apt today   PT Stop Time 1040   PT Time Calculation (min) 47 min   Activity Tolerance Patient tolerated treatment well;No increased pain  Pain scale reduced from 3/10 to 1.5/10 at EOS   Behavior During Therapy [facility] for tasks assessed/performed      Past Medical History:  Diagnosis Date  . Anemia   . Breast cancer ([place])    left breast cancer  . GERD (gastroesophageal reflux disease)   . Personal history of radiation therapy     Past Surgical History:  Procedure Laterality Date  . ABDOMINAL HYSTERECTOMY    . COLONOSCOPY    . COLONOSCOPY N/A 11/07/2015   Dr. Gala Romney: two 4 mm polyps in one 9 mm removed from the splenic flexure, one tubular adenoma. Colonoscopy in August 2022.   . ESOPHAGOGASTRODUODENOSCOPY (EGD) WITH PROPOFOL N/A 07/21/2016   Procedure: ESOPHAGOGASTRODUODENOSCOPY (EGD) WITH PROPOFOL;  Surgeon: Daneil Dolin, MD;  [facility];  Service: Endoscopy;  Laterality: N/A;  7:30am  . EXPLORATORY LAPAROTOMY    . MALONEY DILATION N/A 07/21/2016   Procedure: Venia Minks DILATION;  Surgeon: Daneil Dolin, MD;  [facility];  Service: Endoscopy;  Laterality: N/A;    There were no vitals filed for this visit.      Subjective Assessment - 10/14/16 0956    Subjective Pt stated she continues to have intermittent thoracic pain scale 3/10 Rt side dull achey that increases with certain positions.      Pertinent History unremarkable    Patient Stated Goals get rid of the pain    Currently in Pain? Yes   Pain Score 3    Pain Location Back   Pain Orientation Right;Mid   Pain Descriptors / Indicators Dull;Aching   Pain Type Chronic pain   Pain Onset More than a month ago   Pain Frequency Intermittent   Aggravating Factors  lying on back or stomach pt works though it   Pain Relieving Factors nothing pt just works through it                         [employer] Adult PT Treatment/Exercise - 10/14/16 0001      Lumbar Exercises: Machines for Strengthening   Other Lumbar Machine Exercise Cybex chest press 3 pl 2x 15; Row 2PL 2x15   Other Lumbar Machine Exercise total gym lats pull down 8Pl 15x     Lumbar Exercises: Standing   Functional Squats 5 reps   Functional Squats Limitations proper lifting blue weighted ball     Lumbar Exercises: Seated   Other Seated Lumbar Exercises 3D thoracic excursions 1x15 all directions; cybex chest press 2 pl x20      Lumbar Exercises: Quadruped   Madcat/Old Horse 10 reps   Madcat/Old Horse Limitations rotation     Manual Therapy  Manual Therapy Soft tissue mobilization   Manual therapy comments done seperate from all other aspects of treatment    Soft tissue mobilization STM to Rt thoracic paraspinals, Rt rib cage intercoastals                  PT Short Term Goals - 09/25/16 1121      PT SHORT TERM GOAL #1   Title Pt to have no more radicular sx to demonstrate decreased nerve irritation.    Time 2   Period Weeks   Status New     PT SHORT TERM GOAL #2   Title Pt to be able to verbalize and demonstrate proper body mechanics for lifting to decrease stress on thoracic and lumbar region.    Time 2   Period Weeks   Status New     PT SHORT TERM GOAL #3   Title Pain in pt thoracic area  to be no greater than a 4/10 at night to improve pt sleeping    Time 2   Period Weeks   Status New           PT Long Term  Goals - 09/25/16 1123      PT LONG TERM GOAL #1   Title Pt to state that she has not had any pain in her thoracic area  in the past week to allow pt to have a full night sleep    Time 4   Period Weeks   Status New     PT LONG TERM GOAL #2   Title Pt to be I in Hep to control sx of pain    Time 4   Period Weeks   Status New     PT LONG TERM GOAL #3   Title Pt to be able to complete lifting at work without having any increased back or rib pain.    Time 4   Period Weeks               Plan - 10/14/16 1048    Clinical Impression Statement Continue with spinal mobiltiy exercises and postural strengthening therex with additional educated for proper body mechanics with lifting.  Pt able to demonstated appropriate form following initial cueing with minimal difficutly.  EOS with manual to address spasms noted Rt thoracic spine region and rib cage.  Able to reduce some tightness, noted moderate inflammation present over ribs.  Pt encouraged to apply heat for muscle relaxation f/b ice for inflammation, pt verbalized understanding.  EOS reports of pain reduced to 1.5/10   Rehab Potential Good   PT Frequency 2x / week   PT Duration 4 weeks   PT Treatment/Interventions ADLs/Self Care Home Management;Therapeutic activities;Therapeutic exercise;Patient/family education;Manual techniques;Ultrasound;Electrical Stimulation;Other (comment)   PT Next Visit Plan increased time to manual next session to address thoracic spasms.  Continue cybex and postural strengthening.     PT Home Exercise Plan mad cat old horse, POE and press up.       Patient will benefit from skilled therapeutic intervention in order to improve the following deficits and impairments:  Decreased activity tolerance, Pain, Postural dysfunction  Visit Diagnosis: Chronic bilateral thoracic back pain  Muscle spasm of back     Problem List Patient Active Problem List   Diagnosis Date Noted  . Abdominal pain, epigastric  06/13/2016  . Esophageal dysphagia 06/13/2016   Ihor Austin, LPTA; [place]  Aldona Lento 10/14/2016, 10:59 AM  [place] [address]  [place], [place], [address] Phone: [phone]   Fax:  [phone]  Name: KWANA RINGEL MRN: [id number] Date of Birth: 10/31/1960

## 2016-10-16 ENCOUNTER — Ambulatory Visit (HOSPITAL_COMMUNITY): Payer: Commercial Managed Care - PPO

## 2016-10-16 DIAGNOSIS — M62830 Muscle spasm of back: Secondary | ICD-10-CM

## 2016-10-16 DIAGNOSIS — M546 Pain in thoracic spine: Principal | ICD-10-CM

## 2016-10-16 DIAGNOSIS — G8929 Other chronic pain: Secondary | ICD-10-CM | POA: Diagnosis not present

## 2016-10-16 NOTE — Therapy (Signed)
[place], [place], [address] Phone: [phone]   Fax:  [phone]  Physical Therapy Treatment  Patient Details  Name: Maria Kirby MRN: [id number] Date of Birth: [birth year] Referring Provider: Sinda Du   Encounter Date: 10/16/2016      PT End of Session - 10/16/16 1054    Visit Number 6   Number of Visits 8   Date for PT Re-Evaluation 10/25/16   Authorization Type [employer]   Authorization - Visit Number 6   Authorization - Number of Visits 8   PT Start Time 8185  pt late for apt   PT Stop Time 1113   PT Time Calculation (min) 27 min   Activity Tolerance Patient tolerated treatment well;No increased pain   Behavior During Therapy [facility] for tasks assessed/performed      Past Medical History:  Diagnosis Date  . Anemia   . Breast cancer ([place])    left breast cancer  . GERD (gastroesophageal reflux disease)   . Personal history of radiation therapy     Past Surgical History:  Procedure Laterality Date  . ABDOMINAL HYSTERECTOMY    . COLONOSCOPY    . COLONOSCOPY N/A 11/07/2015   Dr. Gala Romney: two 4 mm polyps in one 9 mm removed from the splenic flexure, one tubular adenoma. Colonoscopy in August 2022.   . ESOPHAGOGASTRODUODENOSCOPY (EGD) WITH PROPOFOL N/A 07/21/2016   Procedure: ESOPHAGOGASTRODUODENOSCOPY (EGD) WITH PROPOFOL;  Surgeon: Daneil Dolin, MD;  [facility];  Service: Endoscopy;  Laterality: N/A;  7:30am  . EXPLORATORY LAPAROTOMY    . MALONEY DILATION N/A 07/21/2016   Procedure: Venia Minks DILATION;  Surgeon: Daneil Dolin, MD;  [facility];  Service: Endoscopy;  Laterality: N/A;    There were no vitals filed for this visit.      Subjective Assessment - 10/16/16 1052    Subjective Pt reports increased pain following last session, feels partially related to the weight.  reports a lot better today, maybe a 1/10   Pertinent History unremarkable    Patient  Stated Goals get rid of the pain    Currently in Pain? Yes   Pain Score 1    Pain Location Back   Pain Orientation Mid;Left  Lt > Rt thoracic pain   Pain Descriptors / Indicators Burning   Pain Type Chronic pain   Pain Onset More than a month ago   Pain Frequency Intermittent                         [facility] Adult PT Treatment/Exercise - 10/16/16 0001      Lumbar Exercises: Standing   Scapular Retraction Both;10 reps;Theraband   Theraband Level (Scapular Retraction) Level 2 (Red)   Row Both;10 reps;Theraband   Theraband Level (Row) Level 2 (Red)   Shoulder Extension Both;10 reps;Theraband   Theraband Level (Shoulder Extension) Level 2 (Red)     Lumbar Exercises: Seated   Other Seated Lumbar Exercises 3D thoracic excursions 1x15 all directions; cybex chest press and row 2 pl x20      Manual Therapy   Manual Therapy Soft tissue mobilization   Manual therapy comments done seperate from all other aspects of treatment    Joint Mobilization .   Soft tissue mobilization STM to thoracic paraspinals, Rt rib cage intercoastals, and periscapular mm  PT Short Term Goals - 09/25/16 1121      PT SHORT TERM GOAL #1   Title Pt to have no more radicular sx to demonstrate decreased nerve irritation.    Time 2   Period Weeks   Status New     PT SHORT TERM GOAL #2   Title Pt to be able to verbalize and demonstrate proper body mechanics for lifting to decrease stress on thoracic and lumbar region.    Time 2   Period Weeks   Status New     PT SHORT TERM GOAL #3   Title Pain in pt thoracic area  to be no greater than a 4/10 at night to improve pt sleeping    Time 2   Period Weeks   Status New           PT Long Term Goals - 09/25/16 1123      PT LONG TERM GOAL #1   Title Pt to state that she has not had any pain in her thoracic area  in the past week to allow pt to have a full night sleep    Time 4   Period Weeks   Status New     PT  LONG TERM GOAL #2   Title Pt to be I in Hep to control sx of pain    Time 4   Period Weeks   Status New     PT LONG TERM GOAL #3   Title Pt to be able to complete lifting at work without having any increased back or rib pain.    Time 4   Period Weeks               Plan - 10/16/16 1422    Clinical Impression Statement Pt late for apt today.  Began postural strengthening wiht therband with good form noted following initial demonstration.  Increased time with manual to address moderate spasms in thoracic spinal region/rib cage as well as periscapular musculature.  No reports of increased pain through session.   Rehab Potential Good   PT Frequency 2x / week   PT Duration 4 weeks   PT Treatment/Interventions ADLs/Self Care Home Management;Therapeutic activities;Therapeutic exercise;Patient/family education;Manual techniques;Ultrasound;Electrical Stimulation;Other (comment)   PT Next Visit Plan increased time to manual next session to address thoracic spasms.  Continue cybex and postural strengthening.        Patient will benefit from skilled therapeutic intervention in order to improve the following deficits and impairments:  Decreased activity tolerance, Pain, Postural dysfunction  Visit Diagnosis: Chronic bilateral thoracic back pain  Muscle spasm of back     Problem List Patient Active Problem List   Diagnosis Date Noted  . Abdominal pain, epigastric 06/13/2016  . Esophageal dysphagia 06/13/2016   Ihor Austin, [address]; [place]  Aldona Lento 10/16/2016, 2:26 PM  [place], [place], [address] Phone: [phone]   Fax:  [phone]  Name: Maria Kirby MRN: [id number] Date of Birth: [birth year]

## 2016-10-21 ENCOUNTER — Other Ambulatory Visit (HOSPITAL_COMMUNITY): Payer: Self-pay | Admitting: Pulmonary Disease

## 2016-10-21 ENCOUNTER — Ambulatory Visit (INDEPENDENT_AMBULATORY_CARE_PROVIDER_SITE_OTHER): Payer: Commercial Managed Care - PPO | Admitting: Gastroenterology

## 2016-10-21 ENCOUNTER — Ambulatory Visit (HOSPITAL_COMMUNITY): Payer: Commercial Managed Care - PPO

## 2016-10-21 ENCOUNTER — Encounter: Payer: Self-pay | Admitting: Gastroenterology

## 2016-10-21 VITALS — BP 121/71 | HR 73 | Temp 97.4°F | Ht 67.0 in | Wt 157.2 lb

## 2016-10-21 DIAGNOSIS — K210 Gastro-esophageal reflux disease with esophagitis: Secondary | ICD-10-CM | POA: Diagnosis not present

## 2016-10-21 DIAGNOSIS — M545 Low back pain: Secondary | ICD-10-CM | POA: Diagnosis not present

## 2016-10-21 DIAGNOSIS — M549 Dorsalgia, unspecified: Principal | ICD-10-CM

## 2016-10-21 DIAGNOSIS — R1013 Epigastric pain: Secondary | ICD-10-CM | POA: Diagnosis not present

## 2016-10-21 DIAGNOSIS — G8929 Other chronic pain: Secondary | ICD-10-CM

## 2016-10-21 DIAGNOSIS — K769 Liver disease, unspecified: Secondary | ICD-10-CM

## 2016-10-21 NOTE — Progress Notes (Signed)
Primary Care Physician: Sinda Du, MD  Primary Gastroenterologist:  Garfield Cornea, MD   Chief Complaint  Patient presents with  . Abdominal Pain    f/u, doing ok; stopped Protonix    HPI: Maria Kirby is a 56 y.o. female here for follow-up of EGD. She had EGD back in [place] she presented with complaints of abdominal pain and black stool upper abdominal pain was severe enough she went to the ED. Cardiac workup was negative. CT of the abdomen and pelvis without contrast unremarkable as far as pain but she had 2 subcentimeter low densities lesions in the right liver too small to characterize. EGD was normal. For history of dysphagia esophagus was dilated and low back revealed superficial tear at the UES. EGD performed for history of abdominal pain and black stool.  Patient states in retrospect she now believes her pain was musculoskeletal. She remembers having some back pain just prior to development of this upper abdominal pain. She has pain in the ribs. Migrates. Currently undergoing physical therapy, exercises, weight lifting, massage therapy. Likely feeling better.  Dysphagia resolved after dilation. Denies heartburn. She notes that she ran out of her pantoprazole and really didn't have any problems. When she resumed pantoprazole she felt like she developed abdominal pain so she stopped it. She's had no reflux. No bowel concerns. No blood in the stool or melena.  In the patient's past medical history there is mention of left breast cancer. Patient states she never had a biopsy. She was only followed with serial mammograms every 6 month and eventually determined to be a benign lesion due to stable appearance. She adamantly denies a history of breast cancer.   No current outpatient prescriptions on file.   No current facility-administered medications for this visit.     Allergies as of 10/21/2016 - Review Complete 10/21/2016  Allergen Reaction Noted  . Dye fdc red [red  dye]  02/15/2014  . Ivp dye [iodinated diagnostic agents]  06/13/2016    ROS:  General: Negative for anorexia, weight loss, fever, chills, fatigue, weakness. ENT: Negative for hoarseness, difficulty swallowing , nasal congestion. CV: Negative for chest pain, angina, palpitations, dyspnea on exertion, peripheral edema.  Respiratory: Negative for dyspnea at rest, dyspnea on exertion, cough, sputum, wheezing.  GI: See history of present illness. GU:  Negative for dysuria, hematuria, urinary incontinence, urinary frequency, nocturnal urination.  Endo: Negative for unusual weight change.    Physical Examination:   BP 121/71   Pulse 73   Temp (!) 97.4 F (36.3 C) (Oral)   Ht 5\' 7"  (1.702 m)   Wt 157 lb 3.2 oz (71.3 kg)   BMI 24.62 kg/m   General: Well-nourished, well-developed in no acute distress.  Eyes: No icterus. Mouth: Oropharyngeal mucosa moist and pink , no lesions erythema or exudate. Lungs: Clear to auscultation bilaterally.  Heart: Regular rate and rhythm, no murmurs rubs or gallops.  Abdomen: Bowel sounds are normal, nontender, nondistended, no hepatosplenomegaly or masses, no abdominal bruits or hernia , no rebound or guarding.   Extremities: No lower extremity edema. No clubbing or deformities. Neuro: Alert and oriented x 4   Skin: Warm and dry, no jaundice.   Psych: Alert and cooperative, normal mood and affect.  Labs:  Lab Results  Component Value Date   CREATININE 0.73 05/19/2016   BUN 8 05/19/2016   NA 137 05/19/2016   K 3.6 05/19/2016   CL 105 05/19/2016   CO2 24 05/19/2016  Lab Results  Component Value Date   ALT 16 05/19/2016   AST 14 (L) 05/19/2016   ALKPHOS 76 05/19/2016   BILITOT 0.2 (L) 05/19/2016   Lab Results  Component Value Date   WBC 11.2 (H) 05/19/2016   HGB 15.6 (H) 05/19/2016   HCT 44.8 05/19/2016   MCV 87.5 05/19/2016   PLT 209 05/19/2016    Imaging Studies: No results found.

## 2016-10-21 NOTE — Assessment & Plan Note (Signed)
56 year old female with reported upper abdominal pain and melena back earlier this brain, EGD findings unremarkable, noncontrast CT without explanation for symptoms. Now suspected be musculoskeletal, doing well with physical therapy. On noncontrast CT she had 2 subcentimeter liver lesions in the right liver lobe which cannot be characterized. I discussed with patient today, consideration of follow-up imaging to determine stability at some point, likely in March 2019. Previously had swelling with IVP dye therefore she may need to have MRI imaging with contrast instead. To discuss with radiology. Further recommendations to follow.

## 2016-10-21 NOTE — Patient Instructions (Signed)
1. I will discuss timing of next imaging to look at liver with Dr. Gala Romney. We will let you know his recommendations.  2. Call with any questions or concerns.

## 2016-10-22 ENCOUNTER — Ambulatory Visit (HOSPITAL_COMMUNITY): Payer: Commercial Managed Care - PPO

## 2016-10-22 DIAGNOSIS — M62830 Muscle spasm of back: Secondary | ICD-10-CM

## 2016-10-22 DIAGNOSIS — M546 Pain in thoracic spine: Principal | ICD-10-CM

## 2016-10-22 DIAGNOSIS — G8929 Other chronic pain: Secondary | ICD-10-CM

## 2016-10-22 NOTE — Progress Notes (Signed)
cc'ed to pcp °

## 2016-10-22 NOTE — Therapy (Signed)
[facility] [address] [place], [place], [address] Phone: [phone]   Fax:  [phone]  Physical Therapy Treatment  Patient Details  Name: Maria Kirby MRN: [id number] Date of Birth: [birth year] Referring Provider: Sinda Du   Encounter Date: 10/22/2016      PT End of Session - 10/22/16 0827    Visit Number 7   Number of Visits 8   Date for PT Re-Evaluation 10/25/16   Authorization Type [employer]   Authorization - Visit Number 7   Authorization - Number of Visits 8   PT Start Time [id number]   PT Stop Time 0900   PT Time Calculation (min) 36 min   Activity Tolerance Patient tolerated treatment well;No increased pain   Behavior During Therapy WFL for tasks assessed/performed      Past Medical History:  Diagnosis Date  . Anemia   . Breast cancer ([place])    left breast cancer. PATIENT DENIES 10/21/16  . GERD (gastroesophageal reflux disease)   . Personal history of radiation therapy     Past Surgical History:  Procedure Laterality Date  . ABDOMINAL HYSTERECTOMY    . COLONOSCOPY    . COLONOSCOPY N/A 11/07/2015   Dr. Gala Romney: two 4 mm polyps in one 9 mm removed from the splenic flexure, one tubular adenoma. Colonoscopy in August 2022.   . ESOPHAGOGASTRODUODENOSCOPY (EGD) WITH PROPOFOL N/A 07/21/2016   Procedure: ESOPHAGOGASTRODUODENOSCOPY (EGD) WITH PROPOFOL;  Surgeon: Daneil Dolin, MD;  [facility];  Service: Endoscopy;  Laterality: N/A;  7:30am  . EXPLORATORY LAPAROTOMY    . MALONEY DILATION N/A 07/21/2016   Procedure: Venia Minks DILATION;  Surgeon: Daneil Dolin, MD;  [facility];  Service: Endoscopy;  Laterality: N/A;    There were no vitals filed for this visit.      Subjective Assessment - 10/22/16 0826    Subjective Pt reports she has difficulty sleeping at night due to rib pain.  No reports of current pain.     Pertinent History unremarkable    Patient Stated Goals get rid of the  pain    Currently in Pain? No/denies                         [facility] Adult PT Treatment/Exercise - 10/22/16 0001      Lumbar Exercises: Stretches   Quadruped Mid Back Stretch 3 reps;30 seconds   Quadruped Mid Back Stretch Limitations child's pose 3 directions     Lumbar Exercises: Standing   Functional Squats 10 reps   Functional Squats Limitations proper lifting blue weighted ball   Scapular Retraction Both;15 reps;Theraband   Theraband Level (Scapular Retraction) Level 3 (Green)   Scapular Retraction Limitations hEP   Row Both;15 reps;Theraband   Theraband Level (Row) Level 3 (Green)   Row Limitations HEP   Shoulder Extension Both;15 reps;Theraband   Theraband Level (Shoulder Extension) Level 3 (Green)   Other Standing Lumbar Exercises Wall pushups 10x     Manual Therapy   Manual Therapy Soft tissue mobilization   Manual therapy comments done seperate from all other aspects of treatment    Soft tissue mobilization STM to thoracic paraspinals, Rt rib cage intercoastals, and periscapular mm                  PT Short Term Goals - 09/25/16 1121      PT SHORT TERM GOAL #1   Title Pt to have  no more radicular sx to demonstrate decreased nerve irritation.    Time 2   Period Weeks   Status New     PT SHORT TERM GOAL #2   Title Pt to be able to verbalize and demonstrate proper body mechanics for lifting to decrease stress on thoracic and lumbar region.    Time 2   Period Weeks   Status New     PT SHORT TERM GOAL #3   Title Pain in pt thoracic area  to be no greater than a 4/10 at night to improve pt sleeping    Time 2   Period Weeks   Status New           PT Long Term Goals - 09/25/16 1123      PT LONG TERM GOAL #1   Title Pt to state that she has not had any pain in her thoracic area  in the past week to allow pt to have a full night sleep    Time 4   Period Weeks   Status New     PT LONG TERM GOAL #2   Title Pt to be I in Hep to  control sx of pain    Time 4   Period Weeks   Status New     PT LONG TERM GOAL #3   Title Pt to be able to complete lifting at work without having any increased back or rib pain.    Time 4   Period Weeks               Plan - 10/22/16 1638    Clinical Impression Statement Added child's pose to address mobility and progressed postural strengthening.  Pt able to demonstrate appropriate form with theraband, reviewed compliance with HEP and given theraband to add at home as well as child's pose for mobility.  EOS with manual to address tightness with intercostals and thoracic musculature.  No reports of increased pain through session.     Rehab Potential Good   PT Frequency 2x / week   PT Duration 4 weeks   PT Treatment/Interventions ADLs/Self Care Home Management;Therapeutic activities;Therapeutic exercise;Patient/family education;Manual techniques;Ultrasound;Electrical Stimulation;Other (comment)   PT Next Visit Plan Reassess next session.        Patient will benefit from skilled therapeutic intervention in order to improve the following deficits and impairments:  Decreased activity tolerance, Pain, Postural dysfunction  Visit Diagnosis: Chronic bilateral thoracic back pain  Muscle spasm of back     Problem List Patient Active Problem List   Diagnosis Date Noted  . Lesion of liver 10/21/2016  . Abdominal pain, epigastric 06/13/2016  . Esophageal dysphagia 06/13/2016   Ihor Austin, [place]; [place]  Aldona Lento 10/22/2016, 12:52 PM  [facility] [address] [place], [place], [address] Phone: [phone]   Fax:  [phone]  Name: EVETT KASSA MRN: [id number] Date of Birth: [birth year]

## 2016-10-22 NOTE — Patient Instructions (Signed)
Side Waist Stretch from Child's Pose    From child's pose, walk hands to left. Reach right hand out on diagonal. Reach hips back toward heels making a C with torso. Breathe into right side waist. Hold for 30 breaths. Repeat 3 times each side.  Copyright  VHI. All rights reserved.

## 2016-10-23 ENCOUNTER — Ambulatory Visit (HOSPITAL_COMMUNITY): Payer: Commercial Managed Care - PPO | Admitting: Physical Therapy

## 2016-10-23 DIAGNOSIS — G8929 Other chronic pain: Secondary | ICD-10-CM | POA: Diagnosis not present

## 2016-10-23 DIAGNOSIS — M546 Pain in thoracic spine: Principal | ICD-10-CM

## 2016-10-23 DIAGNOSIS — M62830 Muscle spasm of back: Secondary | ICD-10-CM | POA: Diagnosis not present

## 2016-10-23 NOTE — Therapy (Signed)
[facility] [place], [place], [address] Phone: [phone]   Fax:  [phone]  Physical Therapy Treatment  Patient Details  Name: Maria Kirby MRN: [id number] Date of Birth: 06-28-1960 Referring Provider: Sinda Du   Encounter Date: 10/23/2016      PT End of Session - 10/23/16 1152    Visit Number 8   Number of Visits 8   Date for PT Re-Evaluation 10/25/16   Authorization Type [employer]   Authorization - Visit Number 8   Authorization - Number of Visits 8   PT Start Time 1125   PT Stop Time 1152   PT Time Calculation (min) 27 min   Activity Tolerance Patient tolerated treatment well;No increased pain   Behavior During Therapy WFL for tasks assessed/performed      Past Medical History:  Diagnosis Date  . Anemia   . Breast cancer ([place])    left breast cancer. PATIENT DENIES 10/21/16  . GERD (gastroesophageal reflux disease)   . Personal history of radiation therapy     Past Surgical History:  Procedure Laterality Date  . ABDOMINAL HYSTERECTOMY    . COLONOSCOPY    . COLONOSCOPY N/A 11/07/2015   Dr. Gala Romney: two 4 mm polyps in one 9 mm removed from the splenic flexure, one tubular adenoma. Colonoscopy in August 2022.   . ESOPHAGOGASTRODUODENOSCOPY (EGD) WITH PROPOFOL N/A 07/21/2016   Procedure: ESOPHAGOGASTRODUODENOSCOPY (EGD) WITH PROPOFOL;  Surgeon: Daneil Dolin, MD;  [facility];  Service: Endoscopy;  Laterality: N/A;  7:30am  . EXPLORATORY LAPAROTOMY    . MALONEY DILATION N/A 07/21/2016   Procedure: Venia Minks DILATION;  Surgeon: Daneil Dolin, MD;  [facility];  Service: Endoscopy;  Laterality: N/A;    There were no vitals filed for this visit.      Subjective Assessment - 10/23/16 1126    Subjective Pt is still having trouble at night sleeping.  Today it is on both sides.    Pertinent History unremarkable    How long can you sit comfortably? unlimited   How long can  you stand comfortably? unlimited   How long can you walk comfortably? unlimited   Patient Stated Goals get rid of the pain    Currently in Pain? Yes   Pain Score 3    Pain Location Rib cage   Pain Orientation Right;Left   Pain Descriptors / Indicators Aching   Pain Type Chronic pain            [facility] PT Assessment - 10/23/16 0001      Assessment   Medical Diagnosis radicular thoracic pain B    Referring Provider Sinda Du    Next MD Visit 10/21/2016   Prior Therapy none     Precautions   Precautions None     Restrictions   Weight Bearing Restrictions No     Balance Screen   Has the patient fallen in the past 6 months No   Has the patient had a decrease in activity level because of a fear of falling?  No   Is the patient reluctant to leave their home because of a fear of falling?  No     Home Ec[employer] residence     Prior Function   Level of Independence Independent   Vocation Full time employment   Vocation Requirements standing, driving and light lifting      Cognition   Overall Cognitive Status  Within Functional Limits for tasks assessed     Observation/Other Assessments   Focus on Therapeutic Outcomes (FOTO)  68     Posture/Postural Control   Posture/Postural Control Postural limitations   Postural Limitations Decreased thoracic kyphosis     ROM / Strength   AROM / PROM / Strength AROM;Strength     AROM   Overall AROM Comments ROM wfl    AROM Assessment Site Thoracic   Thoracic Flexion no change of sx with reps    Thoracic Extension with dowel to promote extesion reps no change      Palpation   Palpation comment no more spasms                     [facility] Adult PT Treatment/Exercise - 10/23/16 0001      Manual Therapy   Manual Therapy Soft tissue mobilization   Manual therapy comments done seperate from all other aspects of treatment    Joint Mobilization thoracic mobilizatin anterior/posterior     Soft tissue mobilization STM to thoracic paraspinals, Rt rib cage intercoastals, and periscapular mm                  PT Short Term Goals - 10/23/16 1134      PT SHORT TERM GOAL #1   Title Pt to have no more radicular sx to demonstrate decreased nerve irritation.    Time 2   Period Weeks   Status Not Met     PT SHORT TERM GOAL #2   Title Pt to be able to verbalize and demonstrate proper body mechanics for lifting to decrease stress on thoracic and lumbar region.    Time 2   Period Weeks   Status Achieved     PT SHORT TERM GOAL #3   Title Pain in pt thoracic area  to be no greater than a 4/10 at night to improve pt sleeping    Time 2   Period Weeks   Status Not Met           PT Long Term Goals - 10/23/16 1135      PT LONG TERM GOAL #1   Title Pt to state that she has not had any pain in her thoracic area  in the past week to allow pt to have a full night sleep    Time 4   Period Weeks   Status Not Met     PT LONG TERM GOAL #2   Title Pt to be I in Hep to control sx of pain    Time 4   Period Weeks   Status Not Met  I in exercises but she is still having the same amount of pain     PT LONG TERM GOAL #3   Title Pt to be able to complete lifting at work without having any increased back or rib pain.    Time 4   Period Weeks   Status Partially Met               Plan - 10/23/16 1153    Clinical Impression Statement Ms. Chao was seen for a reassessment.  There has been no ROM of strength deficits her main complaint is radiating thoracic pain at the T7-T8 level into the rib area.  This pain tends to be nocturnal.  At this time we have educated her on posture and body mechanics, completed thoracic mobs and soft tissue mobilization with minimal relief.  We will discharge back  to MD>  The pt does have an MRI scheduled.    Rehab Potential Good   PT Frequency 2x / week   PT Duration 4 weeks   PT Treatment/Interventions ADLs/Self Care Home  Management;Therapeutic activities;Therapeutic exercise;Patient/family education;Manual techniques;Ultrasound;Electrical Stimulation;Other (comment)   PT Next Visit Plan Discharge therapy       Patient will benefit from skilled therapeutic intervention in order to improve the following deficits and impairments:  Decreased activity tolerance, Pain, Postural dysfunction  Visit Diagnosis: Chronic bilateral thoracic back pain  Muscle spasm of back     Problem List Patient Active Problem List   Diagnosis Date Noted  . Lesion of liver 10/21/2016  . Abdominal pain, epigastric 06/13/2016  . Esophageal dysphagia 06/13/2016    Rayetta Humphrey, PT CLT [phone] 10/23/2016, 11:58 AM  [facility] [address] [place], [place], [address] Phone: [phone]   Fax:  [phone]  Name: GEOVANA GEBEL MRN: [id number] Date of Birth: August 12, 1960  PHYSICAL THERAPY DISCHARGE SUMMARY  Visits from Start of Care:8  Current functional level related to goals / functional outcomes: See above    Remaining deficits: See above   Education / Equipment: HEP Plan: Patient agrees to discharge.  Patient goals were not met. Patient is being discharged due to lack of progress.  ?????    Rayetta Humphrey, [place] CLT [phone]

## 2016-10-30 ENCOUNTER — Ambulatory Visit (HOSPITAL_COMMUNITY): Payer: Commercial Managed Care - PPO

## 2016-10-31 ENCOUNTER — Ambulatory Visit (HOSPITAL_COMMUNITY)
Admission: RE | Admit: 2016-10-31 | Discharge: 2016-10-31 | Disposition: A | Discharge status: Home / Self Care | Payer: Commercial Managed Care - PPO | Source: Ambulatory Visit | Attending: Pulmonary Disease | Admitting: Pulmonary Disease

## 2016-10-31 DIAGNOSIS — M5126 Other intervertebral disc displacement, lumbar region: Secondary | ICD-10-CM | POA: Insufficient documentation

## 2016-10-31 DIAGNOSIS — M1288 Other specific arthropathies, not elsewhere classified, other specified site: Secondary | ICD-10-CM | POA: Insufficient documentation

## 2016-10-31 DIAGNOSIS — M549 Dorsalgia, unspecified: Secondary | ICD-10-CM | POA: Diagnosis present

## 2016-10-31 DIAGNOSIS — G8929 Other chronic pain: Secondary | ICD-10-CM

## 2016-10-31 DIAGNOSIS — M4807 Spinal stenosis, lumbosacral region: Secondary | ICD-10-CM | POA: Insufficient documentation

## 2016-10-31 DIAGNOSIS — M47816 Spondylosis without myelopathy or radiculopathy, lumbar region: Secondary | ICD-10-CM | POA: Diagnosis not present

## 2016-10-31 DIAGNOSIS — M47814 Spondylosis without myelopathy or radiculopathy, thoracic region: Secondary | ICD-10-CM | POA: Diagnosis not present

## 2016-12-09 DIAGNOSIS — L403 Pustulosis palmaris et plantaris: Secondary | ICD-10-CM | POA: Diagnosis not present

## 2016-12-09 DIAGNOSIS — L400 Psoriasis vulgaris: Secondary | ICD-10-CM | POA: Diagnosis not present

## 2016-12-29 DIAGNOSIS — M545 Low back pain: Secondary | ICD-10-CM | POA: Diagnosis not present

## 2016-12-30 ENCOUNTER — Other Ambulatory Visit: Payer: Self-pay | Admitting: Pulmonary Disease

## 2016-12-30 DIAGNOSIS — G8929 Other chronic pain: Secondary | ICD-10-CM

## 2016-12-30 DIAGNOSIS — M545 Low back pain: Principal | ICD-10-CM

## 2017-01-09 ENCOUNTER — Ambulatory Visit
Admission: RE | Admit: 2017-01-09 | Discharge: 2017-01-09 | Disposition: A | Discharge status: Home / Self Care | Payer: Commercial Managed Care - PPO | Source: Ambulatory Visit | Attending: Pulmonary Disease | Admitting: Pulmonary Disease

## 2017-01-09 DIAGNOSIS — G8929 Other chronic pain: Secondary | ICD-10-CM

## 2017-01-09 DIAGNOSIS — M545 Low back pain: Principal | ICD-10-CM

## 2017-01-09 DIAGNOSIS — M47814 Spondylosis without myelopathy or radiculopathy, thoracic region: Secondary | ICD-10-CM | POA: Diagnosis not present

## 2017-01-09 MED ORDER — IOPAMIDOL (ISOVUE-M 200) INJECTION 41%
1.0000 mL | Freq: Once | INTRAMUSCULAR | Status: AC
  Administered 2017-01-09: 1 mL via EPIDURAL

## 2017-01-09 MED ORDER — TRIAMCINOLONE ACETONIDE 40 MG/ML IJ SUSP (RADIOLOGY)
60.0000 mg | Freq: Once | INTRAMUSCULAR | Status: AC
  Administered 2017-01-09: 60 mg via EPIDURAL

## 2017-01-09 NOTE — Discharge Instructions (Signed)

## 2017-01-21 DIAGNOSIS — G4700 Insomnia, unspecified: Secondary | ICD-10-CM | POA: Diagnosis not present

## 2017-01-21 DIAGNOSIS — R109 Unspecified abdominal pain: Secondary | ICD-10-CM | POA: Diagnosis not present

## 2017-03-12 ENCOUNTER — Emergency Department (HOSPITAL_COMMUNITY): Payer: Commercial Managed Care - PPO

## 2017-03-12 ENCOUNTER — Emergency Department (HOSPITAL_COMMUNITY)
Admission: EM | Admit: 2017-03-12 | Discharge: 2017-03-12 | Disposition: A | Discharge status: Home / Self Care | Payer: Commercial Managed Care - PPO | Attending: Emergency Medicine | Admitting: Emergency Medicine

## 2017-03-12 ENCOUNTER — Encounter (HOSPITAL_COMMUNITY): Payer: Self-pay | Admitting: Emergency Medicine

## 2017-03-12 DIAGNOSIS — Z923 Personal history of irradiation: Secondary | ICD-10-CM | POA: Diagnosis not present

## 2017-03-12 DIAGNOSIS — R072 Precordial pain: Secondary | ICD-10-CM | POA: Diagnosis not present

## 2017-03-12 DIAGNOSIS — F17210 Nicotine dependence, cigarettes, uncomplicated: Secondary | ICD-10-CM | POA: Insufficient documentation

## 2017-03-12 DIAGNOSIS — R002 Palpitations: Secondary | ICD-10-CM | POA: Insufficient documentation

## 2017-03-12 DIAGNOSIS — R0789 Other chest pain: Secondary | ICD-10-CM | POA: Diagnosis present

## 2017-03-12 DIAGNOSIS — Z853 Personal history of malignant neoplasm of breast: Secondary | ICD-10-CM | POA: Insufficient documentation

## 2017-03-12 LAB — CBC
HCT: 40.3 % (ref 36.0–46.0)
HEMOGLOBIN: 13 g/dL (ref 12.0–15.0)
MCH: 29 pg (ref 26.0–34.0)
MCHC: 32.3 g/dL (ref 30.0–36.0)
MCV: 90 fL (ref 78.0–100.0)
PLATELETS: 212 10*3/uL (ref 150–400)
RBC: 4.48 MIL/uL (ref 3.87–5.11)
RDW: 15.5 % (ref 11.5–15.5)
WBC: 9.3 10*3/uL (ref 4.0–10.5)

## 2017-03-12 LAB — I-STAT TROPONIN, ED: TROPONIN I, POC: 0 ng/mL (ref 0.00–0.08)

## 2017-03-12 LAB — BASIC METABOLIC PANEL
ANION GAP: 12 (ref 5–15)
BUN: 9 mg/dL (ref 6–20)
CALCIUM: 9.3 mg/dL (ref 8.9–10.3)
CO2: 24 mmol/L (ref 22–32)
CREATININE: 0.83 mg/dL (ref 0.44–1.00)
Chloride: 104 mmol/L (ref 101–111)
GFR calc non Af Amer: >60 mL/min (ref >60)
GLUCOSE: 106 mg/dL — AB (ref 65–99)
Potassium: 3.6 mmol/L (ref 3.5–5.1)
Sodium: 140 mmol/L (ref 135–145)

## 2017-03-12 LAB — TSH: TSH: 1.635 u[IU]/mL (ref 0.350–4.500)

## 2017-03-12 LAB — MAGNESIUM: Magnesium: 2 mg/dL (ref 1.7–2.4)

## 2017-03-12 NOTE — ED Provider Notes (Signed)
Emergency Department Provider Note   I have reviewed the triage vital signs and the nursing notes.   HISTORY  Chief Complaint Chest Pain   HPI Maria Kirby is a 57 y.o. female with PMH of anemia, GERD, and thyroid disease presents to the emergency department for evaluation of intermittent palpitations with chest heaviness.  Symptoms have been intermittent over the last 3 days.  She describes momentary sensation of the heart "skipping a beat."  During this episode she will have some associated heaviness in the chest that resolves immediately.  She estimates 1-2 seconds of symptoms which occur at random intervals.  No pleuritic or exertional chest heaviness.  No dyspnea.  No modifying factors.  She states that she does have thyroid disease but is not sure if thyroid is low or high.  She follows regularly with Dr. Luan Pulling and reports having normal thyroid blood work in October of 2018.    Past Medical History:  Diagnosis Date  . Anemia   . Breast cancer ([place])    left breast cancer. PATIENT DENIES 10/21/16  . GERD (gastroesophageal reflux disease)   . Personal history of radiation therapy     Patient Active Problem List   Diagnosis Date Noted  . Lesion of liver 10/21/2016  . Abdominal pain, epigastric 06/13/2016  . Esophageal dysphagia 06/13/2016    Past Surgical History:  Procedure Laterality Date  . ABDOMINAL HYSTERECTOMY    . COLONOSCOPY    . COLONOSCOPY N/A 11/07/2015   Dr. Gala Romney: two 4 mm polyps in one 9 mm removed from the splenic flexure, one tubular adenoma. Colonoscopy in August 2022.   . ESOPHAGOGASTRODUODENOSCOPY (EGD) WITH PROPOFOL N/A 07/21/2016   Procedure: ESOPHAGOGASTRODUODENOSCOPY (EGD) WITH PROPOFOL;  Surgeon: Daneil Dolin, MD;  [facility];  Service: Endoscopy;  Laterality: N/A;  7:30am  . EXPLORATORY LAPAROTOMY    . MALONEY DILATION N/A 07/21/2016   Procedure: Venia Minks DILATION;  Surgeon: Daneil Dolin, MD;  [facility];  Service:  Endoscopy;  Laterality: N/A;    Current Outpatient Rx  . Order #: [id number] Class: Historical Med  . Order #: [id number] Class: Historical Med    Allergies Dye fdc red [red dye] and Ivp dye [iodinated diagnostic agents]  Family History  Problem Relation Age of Onset  . Diabetes Mother   . Hypertension Mother   . Cancer Father 63       colon  . Colon cancer Father     Social History Social History   Tobacco Use  . Smoking status: Current Every Day Smoker    Packs/day: 0.50    Years: 30.00    Pack years: 15.00    Types: Cigarettes  . Smokeless tobacco: Never Used  Substance Use Topics  . Alcohol use: No    Alcohol/week: 0.0 oz  . Drug use: No    Review of Systems  Constitutional: No fever/chills Eyes: No visual changes. ENT: No sore throat. Cardiovascular: Positive chest pain and palpitations  Respiratory: Denies shortness of breath. Gastrointestinal: No abdominal pain.  No nausea, no vomiting.  No diarrhea.  No constipation. Genitourinary: Negative for dysuria. Musculoskeletal: Negative for back pain. Skin: Negative for rash. Neurological: Negative for headaches, focal weakness or numbness.  10-point ROS otherwise negative.  ____________________________________________   PHYSICAL EXAM:  VITAL SIGNS: ED Triage Vitals  Enc Vitals Group     BP 03/12/17 0942 (!) 153/72     Pulse Rate 03/12/17 0939 72     Resp 03/12/17 0939 20  Temp 03/12/17 0939 98.2 F (36.8 C)     Temp Source 03/12/17 0939 Oral     SpO2 03/12/17 0939 100 %     Weight 03/12/17 0941 150 lb (68 kg)     Height 03/12/17 0941 5\' 7"  (1.702 m)     Pain Score 03/12/17 0938 3   Constitutional: Alert and oriented. Well appearing and in no acute distress. Eyes: Conjunctivae are normal.  Head: Atraumatic. Nose: No congestion/rhinnorhea. Mouth/Throat: Mucous membranes are moist.   Neck: No stridor.  Cardiovascular: Normal rate, regular rhythm with occasional PVC on monitor. Good peripheral  circulation. Grossly normal heart sounds.   Respiratory: Normal respiratory effort.  No retractions. Lungs CTAB. Gastrointestinal: Soft and nontender. No distention.  Musculoskeletal: No lower extremity tenderness nor edema. No gross deformities of extremities. Neurologic:  Normal speech and language. No gross focal neurologic deficits are appreciated.  Skin:  Skin is warm, dry and intact. No rash noted.  ____________________________________________   LABS (all labs ordered are listed, but only abnormal results are displayed)  Labs Reviewed  BASIC METABOLIC PANEL - Abnormal; Notable for the following components:      Result Value   Glucose, Bld 106 (*)    All other components within normal limits  CBC  MAGNESIUM  TSH  I-STAT TROPONIN, ED   ____________________________________________  EKG   EKG Interpretation  Date/Time:  Thursday March 12 2017 09:42:16 EST Ventricular Rate:  70 PR Interval:    QRS Duration: 84 QT Interval:  397 QTC Calculation: 429 R Axis:   70 Text Interpretation:  Sinus rhythm No STEMI.  Confirmed by Nanda Quinton [phone]) on 03/12/2017 9:44:35 AM       ____________________________________________  RADIOLOGY  Dg Chest 2 View  Result Date: 03/12/2017 CLINICAL DATA:  Chest heaviness. EXAM: CHEST  2 VIEW COMPARISON:  07/30/2016 08/26/2006. FINDINGS: Mediastinum and hilar structures normal. Lungs are clear. No pleural effusion or pneumothorax. Left apical pleural-parenchymal thickening consistent scarring. Heart size normal. No acute bony abnormality. IMPRESSION: No acute cardiopulmonary disease. Left apical pleural-parenchymal thickening consistent with scarring. Electronically Signed   By: Marcello Moores  Register   On: 03/12/2017 10:54    ____________________________________________   PROCEDURES  Procedure(s) performed:   Procedures  None ____________________________________________   INITIAL IMPRESSION / ASSESSMENT AND PLAN / ED  COURSE  Pertinent labs & imaging results that were available during my care of the patient were reviewed by me and considered in my medical decision making (see chart for details).  Patient presents to the emergency department for evaluation of intermittent chest heaviness with palpitation sensation over the last 3 days.  Patient will have PVCs on monitor and identifies that this is the point which she is having the palpitations and heaviness.  Very atypical for ACS or PE.  Factors for ACS.  Plan for labs including TSH, chemistry, troponin.  No indication for enzyme trending given the patient's history.  Plan for PCP and cardiology follow-up after ED evaluation if workup is negative.  Labs are unremarkable. Occasional PVCs on monitor. Will follow up with PCP and Cardiology as an outpatient. Discussed return precautions in detail.   At this time, I do not feel there is any life-threatening condition present. I have reviewed and discussed all results (EKG, imaging, lab, urine as appropriate), exam findings with patient. I have reviewed nursing notes and appropriate previous records.  I feel the patient is safe to be discharged home without further emergent workup. Discussed usual and customary return precautions. Patient and family (  if present) verbalize understanding and are comfortable with this plan.  Patient will follow-up with their primary care provider. If they do not have a primary care provider, information for follow-up has been provided to them. All questions have been answered.  ____________________________________________  FINAL CLINICAL IMPRESSION(S) / ED DIAGNOSES  Final diagnoses:  Palpitations  Precordial chest pain    Note:  This document was prepared using Dragon voice recognition software and may include unintentional dictation errors.  Nanda Quinton, MD Emergency Medicine    Long, [facility], MD 03/12/17 [phone]

## 2017-03-12 NOTE — Discharge Instructions (Signed)
You have been seen in the Emergency Department (ED) today for chest pain.  As we have discussed today?s test results are normal, but you may require further testing.  The skipping heartbeat is likely caused by a benign arrhythmia called a pre-ventricular contraction. These typically do not cause pain/pressure so I would like for you to follow up with the cardiologist. Try to decrease your caffeine intake in the mean time.    Please follow up with the recommended doctor as instructed above in these documents regarding today?s emergent visit and your recent symptoms to discuss further management.  Continue to take your regular medications.  Return to the Emergency Department (ED) if you experience any further chest pain/pressure/tightness, difficulty breathing, or sudden sweating, or other symptoms that concern you.   Chest Pain (Nonspecific) It is often hard to give a specific diagnosis for the cause of chest pain. There is always a chance that your pain could be related to something serious, such as a heart attack or a blood clot in the lungs. You need to follow up with your health care provider for further evaluation. CAUSES  Heartburn. Pneumonia or bronchitis. Anxiety or stress. Inflammation around your heart (pericarditis) or lung (pleuritis or pleurisy). A blood clot in the lung. A collapsed lung (pneumothorax). It can develop suddenly on its own (spontaneous pneumothorax) or from trauma to the chest. Shingles infection (herpes zoster virus). The chest wall is composed of bones, muscles, and cartilage. Any of these can be the source of the pain. The bones can be bruised by injury. The muscles or cartilage can be strained by coughing or overwork. The cartilage can be affected by inflammation and become sore (costochondritis). DIAGNOSIS  Lab tests or other studies may be needed to find the cause of your pain. Your health care provider may have you take a test called an ambulatory  electrocardiogram (ECG). An ECG records your heartbeat patterns over a 24-hour period. You may also have other tests, such as: Transthoracic echocardiogram (TTE). During echocardiography, sound waves are used to evaluate how blood flows through your heart. Transesophageal echocardiogram (TEE). Cardiac monitoring. This allows your health care provider to monitor your heart rate and rhythm in real time. Holter monitor. This is a portable device that records your heartbeat and can help diagnose heart arrhythmias. It allows your health care provider to track your heart activity for several days, if needed. Stress tests by exercise or by giving medicine that makes the heart beat faster. TREATMENT  Treatment depends on what may be causing your chest pain. Treatment may include: Acid blockers for heartburn. Anti-inflammatory medicine. Pain medicine for inflammatory conditions. Antibiotics if an infection is present. You may be advised to change lifestyle habits. This includes stopping smoking and avoiding alcohol, caffeine, and chocolate. You may be advised to keep your head raised (elevated) when sleeping. This reduces the chance of acid going backward from your stomach into your esophagus. Most of the time, nonspecific chest pain will improve within 2-3 days with rest and mild pain medicine.  HOME CARE INSTRUCTIONS  If antibiotics were prescribed, take them as directed. Finish them even if you start to feel better. For the next few days, avoid physical activities that bring on chest pain. Continue physical activities as directed. Do not use any tobacco products, including cigarettes, chewing tobacco, or electronic cigarettes. Avoid drinking alcohol. Only take medicine as directed by your health care provider. Follow your health care provider's suggestions for further testing if your chest pain does not  go away. Keep any follow-up appointments you made. If you do not go to an appointment, you could  develop lasting (chronic) problems with pain. If there is any problem keeping an appointment, call to reschedule. SEEK MEDICAL CARE IF:  Your chest pain does not go away, even after treatment. You have a rash with blisters on your chest. You have a fever. SEEK IMMEDIATE MEDICAL CARE IF:  You have increased chest pain or pain that spreads to your arm, neck, jaw, back, or abdomen. You have shortness of breath. You have an increasing cough, or you cough up blood. You have severe back or abdominal pain. You feel nauseous or vomit. You have severe weakness. You faint. You have chills. This is an emergency. Do not wait to see if the pain will go away. Get medical help at once. Call your local emergency services (911 in [place] Do not drive yourself to the hospital. MAKE SURE YOU:  Understand these instructions. Will watch your condition. Will get help right away if you are not doing well or get worse. Document Released: 12/04/2004 Document Revised: 03/01/2013 Document Reviewed: 09/30/2007 [facility] Patient Information 2015 [place], [place]. This information is not intended to replace advice given to you by your health care provider. Make sure you discuss any questions you have with your health care provider.

## 2017-03-12 NOTE — ED Triage Notes (Signed)
Patient complaining of palpitations and chest "heaviness" x 3 days.

## 2017-04-27 ENCOUNTER — Other Ambulatory Visit: Payer: Self-pay | Admitting: Pulmonary Disease

## 2017-04-27 DIAGNOSIS — M545 Low back pain: Principal | ICD-10-CM

## 2017-04-27 DIAGNOSIS — G8929 Other chronic pain: Secondary | ICD-10-CM

## 2017-05-08 ENCOUNTER — Ambulatory Visit
Admission: RE | Admit: 2017-05-08 | Discharge: 2017-05-08 | Disposition: A | Discharge status: Home / Self Care | Payer: Commercial Managed Care - PPO | Source: Ambulatory Visit | Attending: Pulmonary Disease | Admitting: Pulmonary Disease

## 2017-05-08 DIAGNOSIS — M545 Low back pain: Principal | ICD-10-CM

## 2017-05-08 DIAGNOSIS — G8929 Other chronic pain: Secondary | ICD-10-CM

## 2017-05-08 MED ORDER — METHYLPREDNISOLONE ACETATE 40 MG/ML INJ SUSP (RADIOLOG: 120.0000 mg | Freq: Once | INTRAMUSCULAR | Status: DC

## 2017-05-08 MED ORDER — IOPAMIDOL (ISOVUE-M 200) INJECTION 41%: 1.0000 mL | Freq: Once | INTRAMUSCULAR | Status: DC

## 2017-05-11 ENCOUNTER — Other Ambulatory Visit: Payer: Self-pay | Admitting: Pulmonary Disease

## 2017-05-11 DIAGNOSIS — M549 Dorsalgia, unspecified: Secondary | ICD-10-CM

## 2017-05-22 ENCOUNTER — Ambulatory Visit
Admission: RE | Admit: 2017-05-22 | Discharge: 2017-05-22 | Disposition: A | Discharge status: Home / Self Care | Payer: Commercial Managed Care - PPO | Source: Ambulatory Visit | Attending: Pulmonary Disease | Admitting: Pulmonary Disease

## 2017-05-22 DIAGNOSIS — M47814 Spondylosis without myelopathy or radiculopathy, thoracic region: Secondary | ICD-10-CM | POA: Diagnosis not present

## 2017-05-22 DIAGNOSIS — M549 Dorsalgia, unspecified: Secondary | ICD-10-CM

## 2017-05-22 MED ORDER — IOPAMIDOL (ISOVUE-M 300) INJECTION 61%
1.0000 mL | Freq: Once | INTRAMUSCULAR | Status: AC | PRN
  Administered 2017-05-22: 1 mL via EPIDURAL

## 2017-05-22 MED ORDER — TRIAMCINOLONE ACETONIDE 40 MG/ML IJ SUSP (RADIOLOGY)
60.0000 mg | Freq: Once | INTRAMUSCULAR | Status: AC
  Administered 2017-05-22: 60 mg via EPIDURAL

## 2017-07-06 ENCOUNTER — Other Ambulatory Visit (HOSPITAL_COMMUNITY): Payer: Self-pay | Admitting: Pulmonary Disease

## 2017-07-06 DIAGNOSIS — Z1231 Encounter for screening mammogram for malignant neoplasm of breast: Secondary | ICD-10-CM

## 2017-07-09 ENCOUNTER — Ambulatory Visit (HOSPITAL_COMMUNITY)
Admission: RE | Admit: 2017-07-09 | Discharge: 2017-07-09 | Disposition: A | Discharge status: Home / Self Care | Payer: Commercial Managed Care - PPO | Source: Ambulatory Visit | Attending: Pulmonary Disease | Admitting: Pulmonary Disease

## 2017-07-09 ENCOUNTER — Encounter (HOSPITAL_COMMUNITY): Payer: Self-pay

## 2017-07-09 DIAGNOSIS — Z1231 Encounter for screening mammogram for malignant neoplasm of breast: Secondary | ICD-10-CM | POA: Diagnosis not present

## 2017-07-22 DIAGNOSIS — M545 Low back pain: Secondary | ICD-10-CM | POA: Diagnosis not present

## 2017-07-22 DIAGNOSIS — M65332 Trigger finger, left middle finger: Secondary | ICD-10-CM | POA: Diagnosis not present

## 2017-07-22 DIAGNOSIS — R252 Cramp and spasm: Secondary | ICD-10-CM | POA: Diagnosis not present

## 2017-07-29 ENCOUNTER — Telehealth: Payer: Self-pay | Admitting: Orthopaedic Surgery

## 2017-07-29 ENCOUNTER — Ambulatory Visit: Payer: Commercial Managed Care - PPO | Admitting: Orthopaedic Surgery

## 2017-07-29 ENCOUNTER — Encounter: Payer: Self-pay | Admitting: Orthopaedic Surgery

## 2017-07-29 VITALS — BP 135/76 | HR 61 | Ht 67.0 in | Wt 150.0 lb

## 2017-07-29 DIAGNOSIS — M6530 Trigger finger, unspecified finger: Secondary | ICD-10-CM

## 2017-07-29 NOTE — Progress Notes (Signed)
Subjective: my long finger locks up    Patient ID: Maria Kirby, female    DOB: 06/17/1960, 57 y.o.   MRN: [id number]  HPI She has had triggering of the left long non dominant finger for several months.  It happens about four to five times a week, and has been happening more often.  She has no swelling, no redness, no trauma.  She was told by a friend she had a trigger finger. She saw Dr. Luan Pulling recently and I have copies of his notes   I have explained the findings to her and that surgery will be the definitive treatment.  I have explained about injections.  She prefers to have injection.   Review of Systems  Musculoskeletal: Positive for arthralgias.  All other systems reviewed and are negative.  Past Medical History:  Diagnosis Date  . Anemia   . Breast cancer ([place])    left breast cancer. PATIENT DENIES 10/21/16  . GERD (gastroesophageal reflux disease)   . Personal history of radiation therapy     Past Surgical History:  Procedure Laterality Date  . ABDOMINAL HYSTERECTOMY    . COLONOSCOPY    . COLONOSCOPY N/A 11/07/2015   Dr. Gala Romney: two 4 mm polyps in one 9 mm removed from the splenic flexure, one tubular adenoma. Colonoscopy in August 2022.   . ESOPHAGOGASTRODUODENOSCOPY (EGD) WITH PROPOFOL N/A 07/21/2016   Procedure: ESOPHAGOGASTRODUODENOSCOPY (EGD) WITH PROPOFOL;  Surgeon: Daneil Dolin, MD;  Location: [facility];  Service: Endoscopy;  Laterality: N/A;  7:30am  . EXPLORATORY LAPAROTOMY    . MALONEY DILATION N/A 07/21/2016   Procedure: Venia Minks DILATION;  Surgeon: Daneil Dolin, MD;  Location: [facility];  Service: Endoscopy;  Laterality: N/A;    Current Outpatient Medications on File Prior to Visit  Medication Sig Dispense Refill  . pantoprazole (PROTONIX) 40 MG tablet Take 40 mg by mouth daily.  3  . traZODone (DESYREL) 50 MG tablet Take 1 tablet by mouth at bedtime.  5   No current facility-administered medications on file prior to visit.     Social  History   Socioeconomic History  . Marital status: Single    Spouse name: Not on file  . Number of children: Not on file  . Years of education: Not on file  . Highest education level: Not on file  Occupational History  . Not on file  Social Needs  . Financial resource strain: Not on file  . Food insecurity:    Worry: Not on file    Inability: Not on file  . Transportation needs:    Medical: Not on file    Non-medical: Not on file  Tobacco Use  . Smoking status: Current Every Day Smoker    Packs/day: 0.50    Years: 30.00    Pack years: 15.00    Types: Cigarettes  . Smokeless tobacco: Never Used  Substance and Sexual Activity  . Alcohol use: No    Alcohol/week: 0.0 oz  . Drug use: No  . Sexual activity: Not Currently  Lifestyle  . Physical activity:    Days per week: Not on file    Minutes per session: Not on file  . Stress: Not on file  Relationships  . Social connections:    Talks on phone: Not on file    Gets together: Not on file    Attends religious service: Not on file    Active member of club or organization: Not on file  Attends meetings of clubs or organizations: Not on file    Relationship status: Not on file  . Intimate partner violence:    Fear of current or ex partner: Not on file    Emotionally abused: Not on file    Physically abused: Not on file    Forced sexual activity: Not on file  Other Topics Concern  . Not on file  Social History Narrative  . Not on file    Family History  Problem Relation Age of Onset  . Diabetes Mother   . Hypertension Mother   . Cancer Father 70       colon  . Colon cancer Father     BP 135/76   Pulse 61   Ht 5\' 7"  (1.702 m)   Wt 150 lb (68 kg)   BMI 23.49 kg/m      Objective:   Physical Exam  Constitutional: She is oriented to person, place, and time. She appears well-developed and well-nourished.  HENT:  Head: Normocephalic and atraumatic.  Eyes: Pupils are equal, round, and reactive to light.  Conjunctivae and EOM are normal.  Neck: Normal range of motion. Neck supple.  Cardiovascular: Normal rate, regular rhythm and intact distal pulses.  Pulmonary/Chest: Effort normal.  Abdominal: Soft.  Musculoskeletal:       Left hand: She exhibits tenderness.       Hands: Neurological: She is alert and oriented to person, place, and time. She has normal reflexes. She displays normal reflexes. No cranial nerve deficit. She exhibits normal muscle tone. Coordination normal.  Skin: Skin is warm and dry.  Psychiatric: She has a normal mood and affect. Her behavior is normal. Judgment and thought content normal.          Assessment & Plan:   Encounter Diagnosis  Name Primary?  . Trigger finger, acquired Yes   Procedure note: After permission from the patient and prep of the left hand, the A1 pulley area was injected with 1% Xylocaine and 1 cc DepoMedrol 40 by sterile technique tolerated well.  I will see her as needed.    Call if any problem.  Precautions discussed.   Electronically Signed Sanjuana Kava, MD 5/22/20198:19 AM

## 2017-07-29 NOTE — Telephone Encounter (Signed)
Patient was seen here today for left long finger trigger finger.  She had injection done but now states that the finger hurts more than it did before the injection.  Please call her and advise what she should do  Thanks

## 2017-07-30 ENCOUNTER — Telehealth: Payer: Self-pay | Admitting: Radiology

## 2017-07-30 NOTE — Telephone Encounter (Signed)
I called her and she said it is feeling a little better this morning.  I told her to come in without an appointment and we would look at it.  She said she worked 3rd shift last night and was going to go home and sleep.  She will come in this evening if it does not continue to improve.  She is using ice every little bit and it helps.  She feels that it is improving slowly.

## 2017-07-30 NOTE — Telephone Encounter (Signed)
She came in after work today for [place] to look at her hand. She had an injection for trigger finger yesterday and her hand is stiff and a little swollen.  He had already left.  I spoke with her.  She stated it was improving but was still very stiff.  I told her to continue to use ice and her ibuprofen for the swelling. She is to hold her hand up higher than her nose and wiggle her fingers often to help get the swelling down.  She has no redness at the injection site.  I told her that Dr. Luna Glasgow will not be in the office until Tuesday and if she gets worse or notes fever and or redness that spreads, she will need to go to the emergency room.

## 2018-03-03 IMAGING — DX DG THORACIC SPINE 3V
3 series · 3 of 3 positions shown · non-contrast
Comparison: 05/19/2016

CLINICAL DATA: Middle back pain

EXAM:
THORACIC SPINE - 3 VIEWS

[t-spine ap]
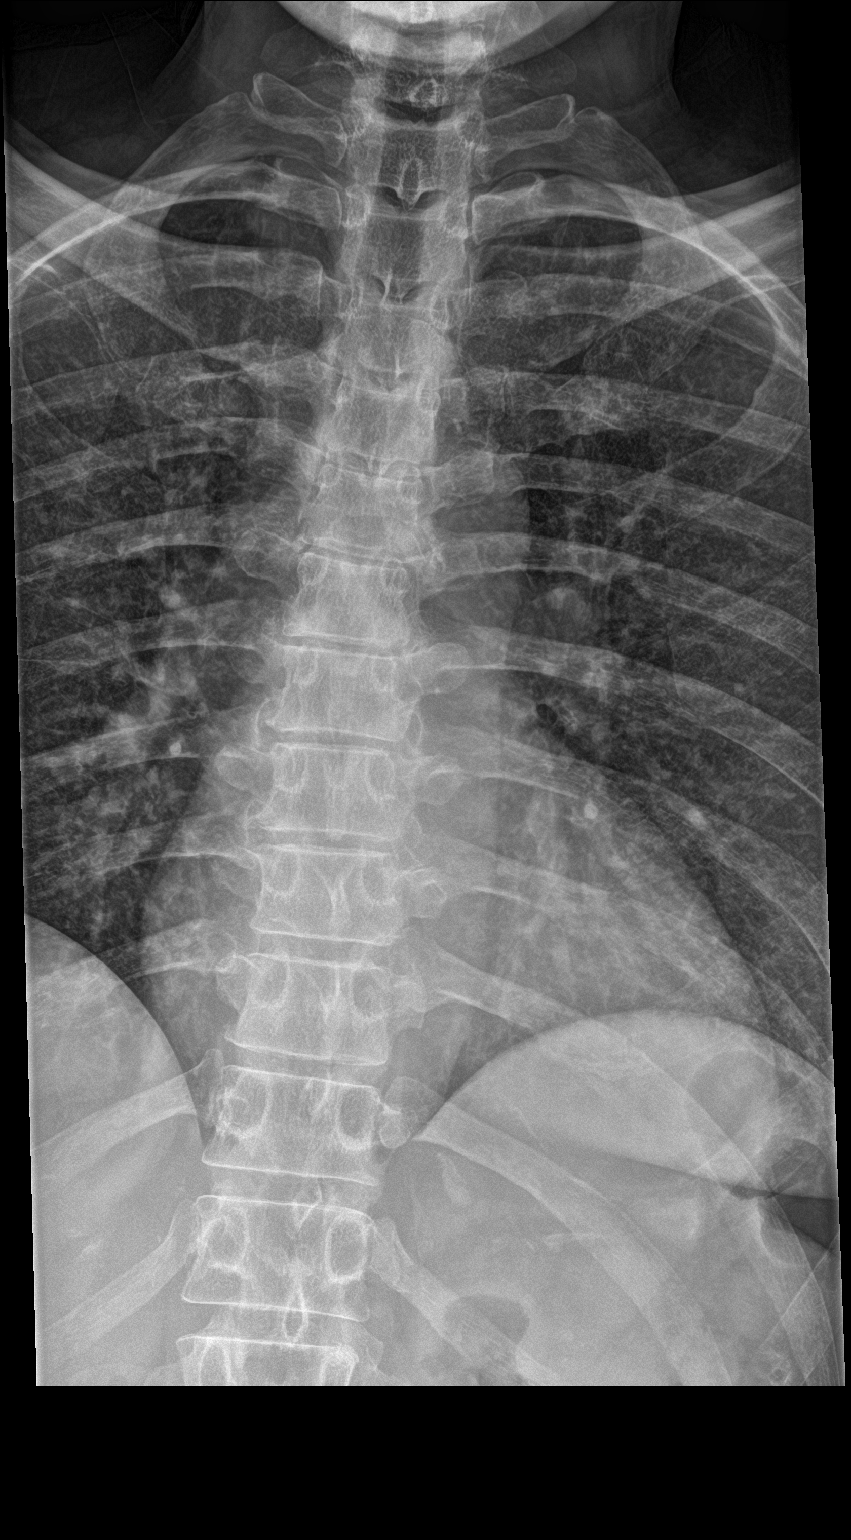

[t-spine lat]
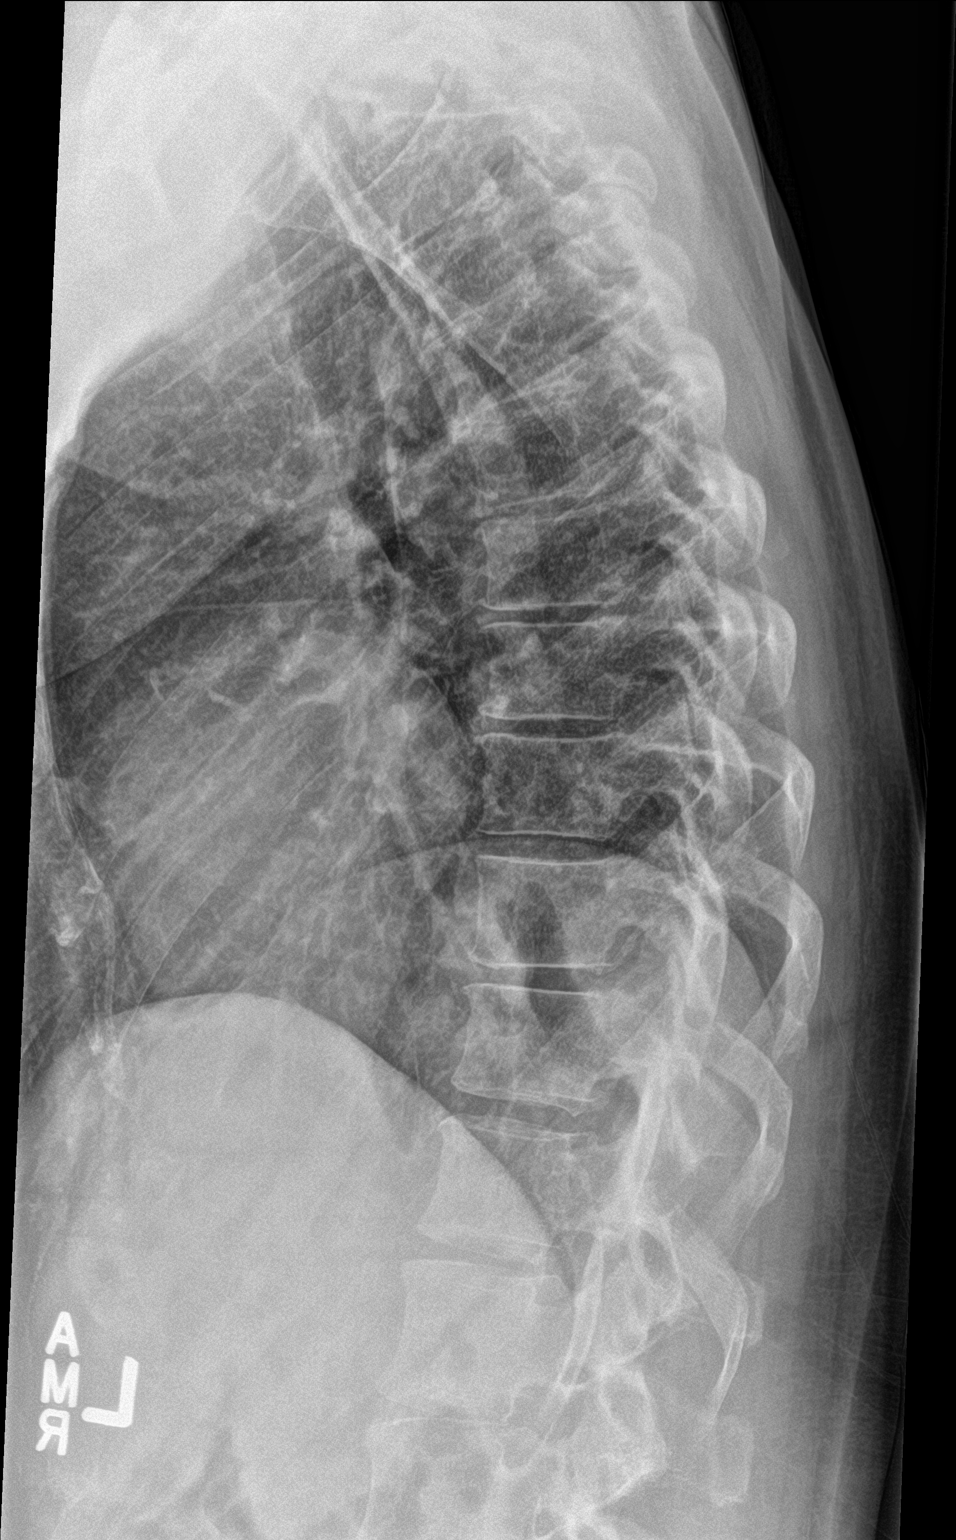

[t-spine swimmers]
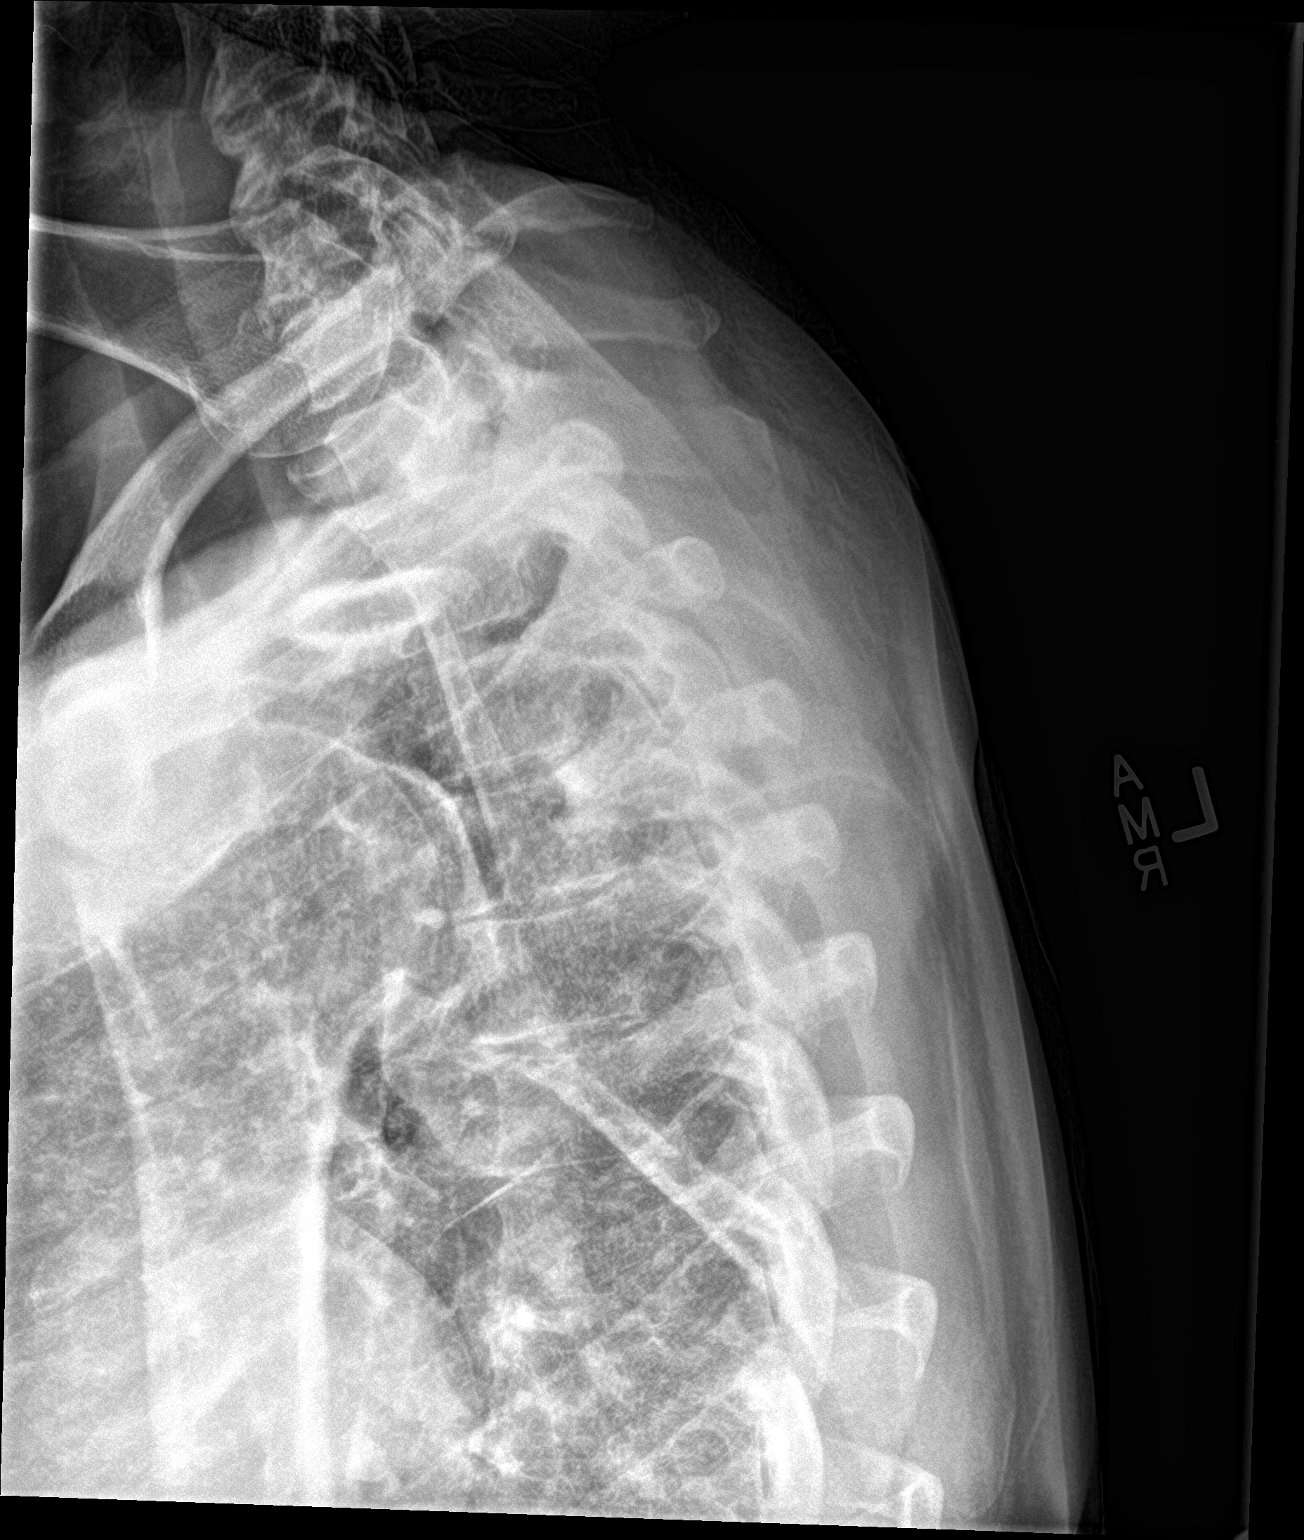

[3 of 3 positions shown; findings below may reference images not displayed]

FINDINGS: There is no evidence of thoracic spine fracture. Alignment is
normal. Minimal spurring of the thoracic spine.
IMPRESSION: Minimal degenerative changes.  No acute osseous abnormality.

## 2018-03-21 ENCOUNTER — Telehealth: Payer: Self-pay | Admitting: Gastroenterology

## 2018-03-21 DIAGNOSIS — K769 Liver disease, unspecified: Secondary | ICD-10-CM

## 2018-03-21 NOTE — Telephone Encounter (Signed)
This patient is due follow up imaging for two subcentimer right liver lesions seen on CT 05/2016. She has IVP dye allergy (swelling).   Offer her MRI liver with contrast. Contrast is different from IVP.

## 2018-03-22 NOTE — Telephone Encounter (Signed)
Lmom, waiting on a return call.  

## 2018-03-23 NOTE — Telephone Encounter (Signed)
Lmom, waiting on a return call.  

## 2018-03-29 ENCOUNTER — Telehealth: Payer: Self-pay | Admitting: Internal Medicine

## 2018-03-29 NOTE — Telephone Encounter (Signed)
Tried calling pt. Mailing letter.

## 2018-03-29 NOTE — Telephone Encounter (Signed)
[employer]ina [employer] at [facility] radiology advised to order MRI Liver with and without contrast. Is this ok?

## 2018-03-29 NOTE — Telephone Encounter (Signed)
Pt returned call and notified of f/u needed Please see LSL instructions on imaging and schedule.

## 2018-03-29 NOTE — Telephone Encounter (Signed)
Pt was returning a call from AM. Please call her back at [phone]

## 2018-03-29 NOTE — Telephone Encounter (Signed)
Yes

## 2018-03-29 NOTE — Telephone Encounter (Signed)
Spoke to pt. Pt is aware of the f/u imaging needed. See other note.

## 2018-03-30 NOTE — Telephone Encounter (Signed)
MRI Liver w/wo contrast scheduled for 04/06/18 at 10:00am, arrive at 9:30am. NPO 4 hours prior to test. Tried to call pt, no answer, LMOVM for return call.

## 2018-03-30 NOTE — Telephone Encounter (Signed)
Called [facility] for PA of MRI Liver. Case# [id number]. Clinical notes faxed to [phone].

## 2018-03-30 NOTE — Telephone Encounter (Signed)
Pt called office, informed of MRI appt. Letter mailed.

## 2018-03-30 NOTE — Addendum Note (Signed)
Addended by: Hassan Rowan on: 03/30/2018 08:14 AM   Modules accepted: Orders

## 2018-04-05 NOTE — Telephone Encounter (Signed)
Called [facility]. MRI approved. PA# [id number], 03/30/18-05/06/18.

## 2018-04-06 ENCOUNTER — Ambulatory Visit (HOSPITAL_COMMUNITY)
Admission: RE | Admit: 2018-04-06 | Discharge: 2018-04-06 | Disposition: A | Discharge status: Home / Self Care | Payer: Commercial Managed Care - PPO | Source: Ambulatory Visit | Attending: Gastroenterology | Admitting: Gastroenterology

## 2018-04-06 DIAGNOSIS — K769 Liver disease, unspecified: Secondary | ICD-10-CM | POA: Insufficient documentation

## 2018-04-06 MED ORDER — GADOBUTROL 1 MMOL/ML IV SOLN
7.0000 mL | Freq: Once | INTRAVENOUS | Status: AC | PRN
  Administered 2018-04-06: 7 mL via INTRAVENOUS

## 2018-06-04 IMAGING — MR MR THORACIC SPINE W/O CM
4 of 6 series · 10 of 48 positions shown · non-contrast
Comparison: None.

CLINICAL DATA: Upper back pain 5 months.

EXAM:
MRI THORACIC AND LUMBAR SPINE WITHOUT CONTRAST
TECHNIQUE: Multiplanar and multiecho pulse sequences of the thoracic and lumbar
spine were obtained without intravenous contrast.

[Series 5: T1 · sagittal · 4.0mm · 0.55mm/px · 3 of 13 slices shown (1 of 2)]
[im 3/13]
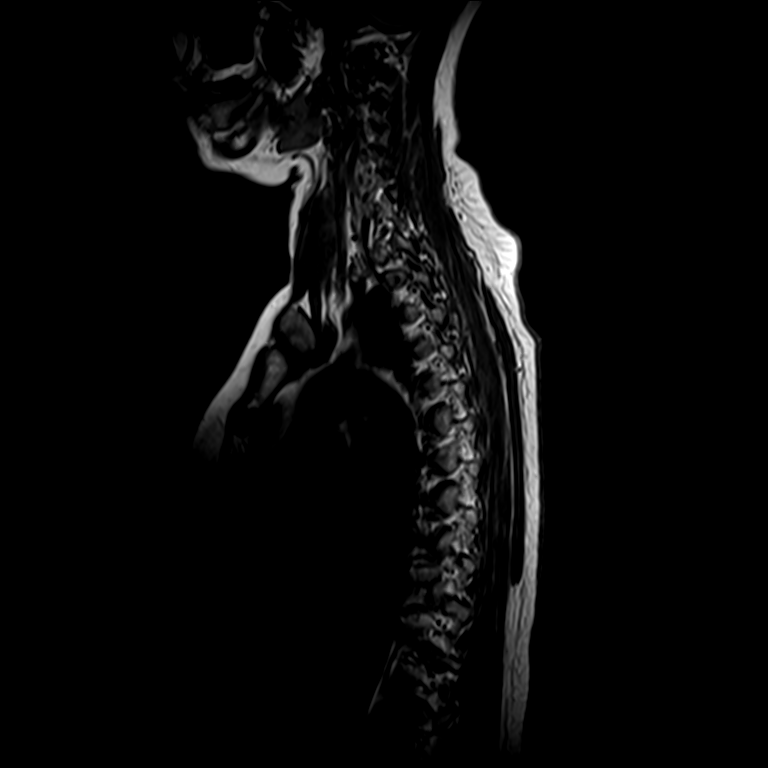
[im 8/13]
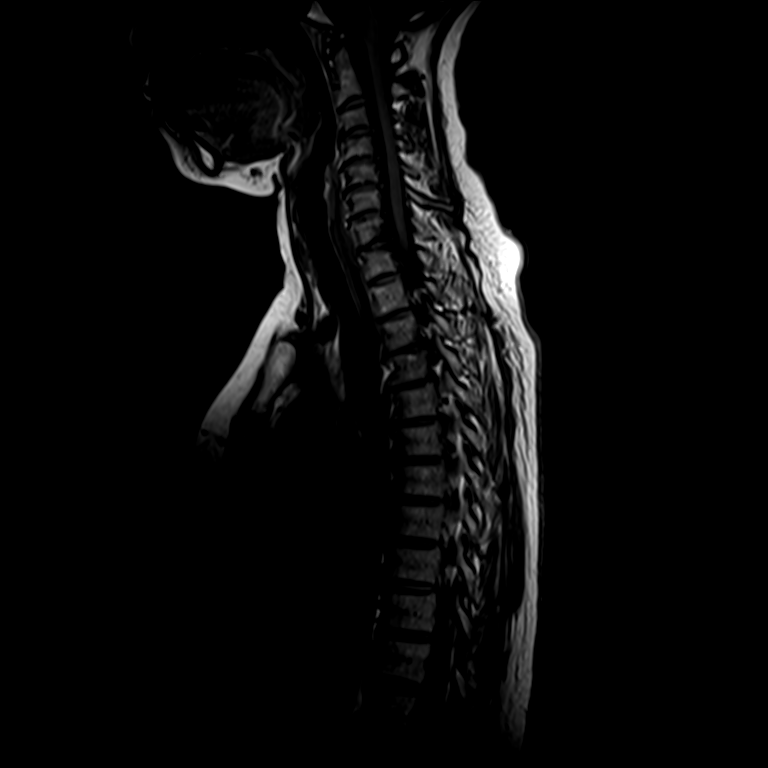
[im 13/13]
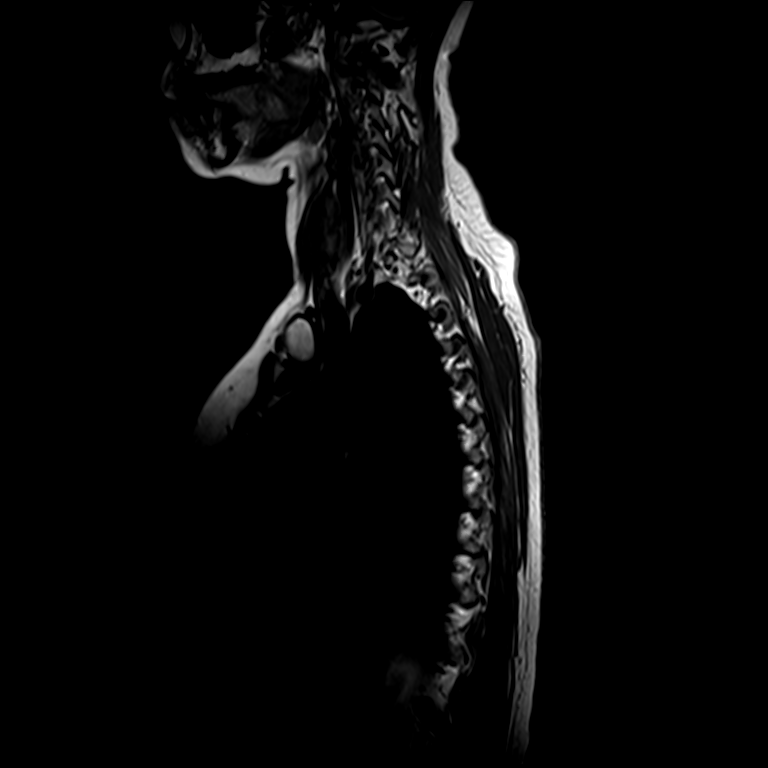

[Series 6: T2 · sagittal · 4.0mm · 0.39mm/px · 3 of 13 slices shown (1 of 2)]
[im 1/13]
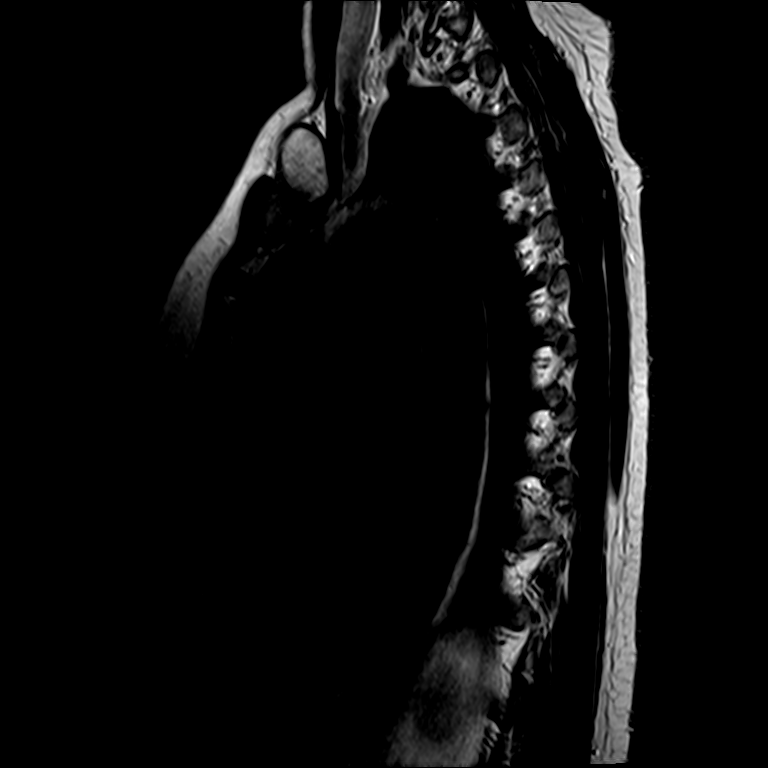
[im 7/13]
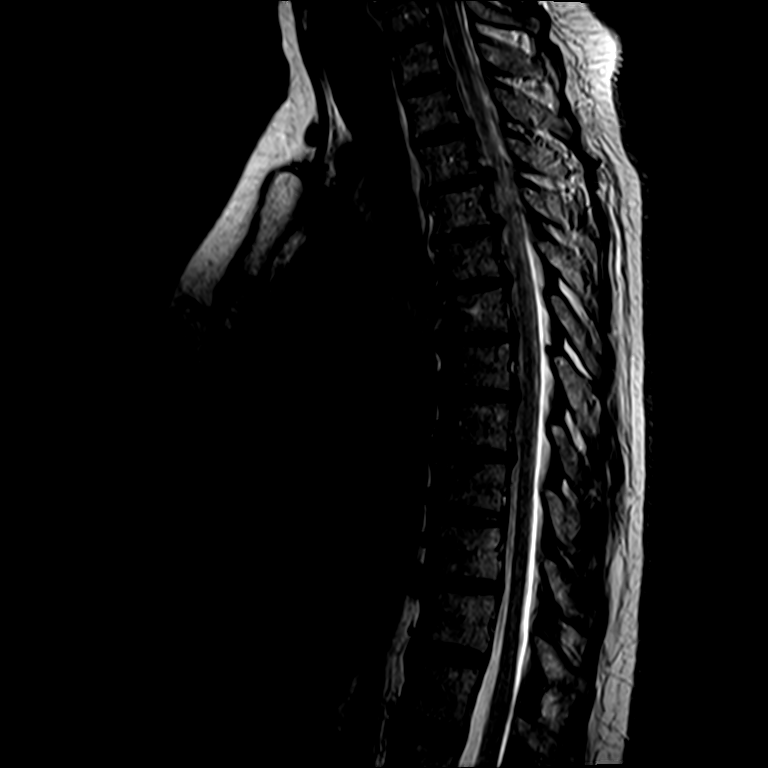
[im 13/13]
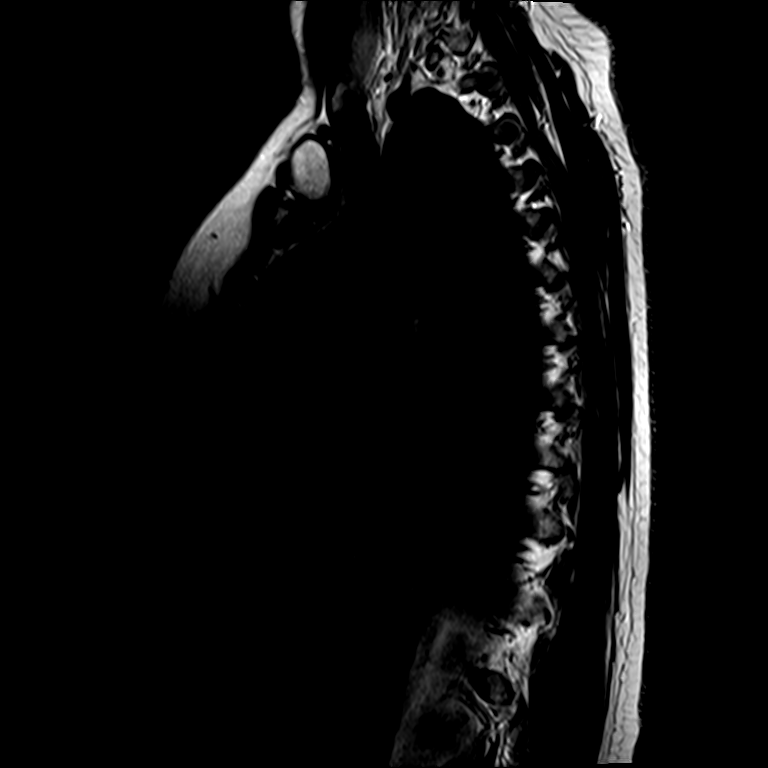

[Series 7: T1 · sagittal · 4.0mm · 0.39mm/px · 1 of 13 slices shown (2 of 2)]
[im 1/13]
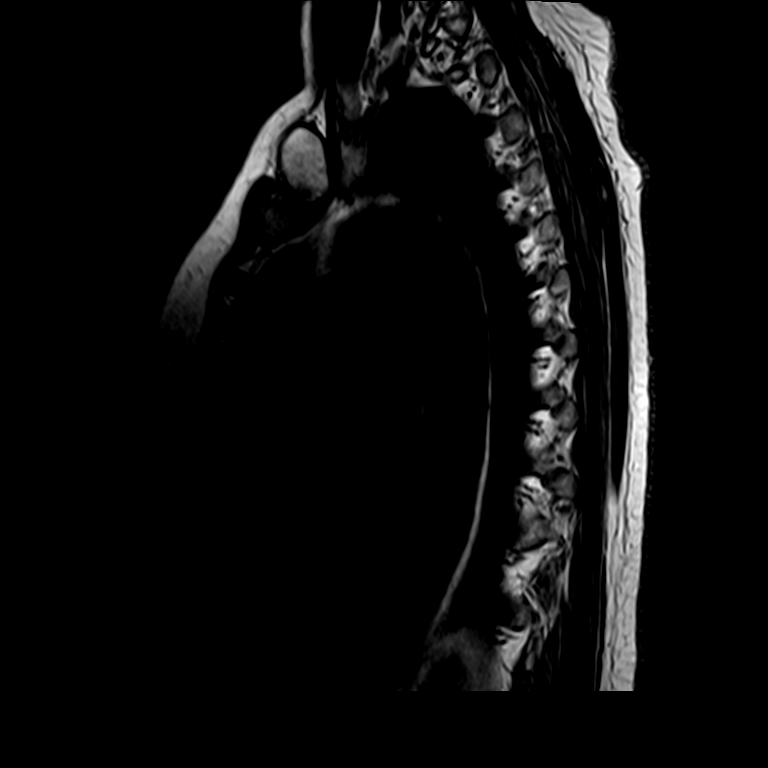

[Series 10: T2 · axial · 3.0mm · 0.28mm/px · z∈[-258,-136]mm · 3 of 33 slices shown (2 of 2)]
[im 6/33]
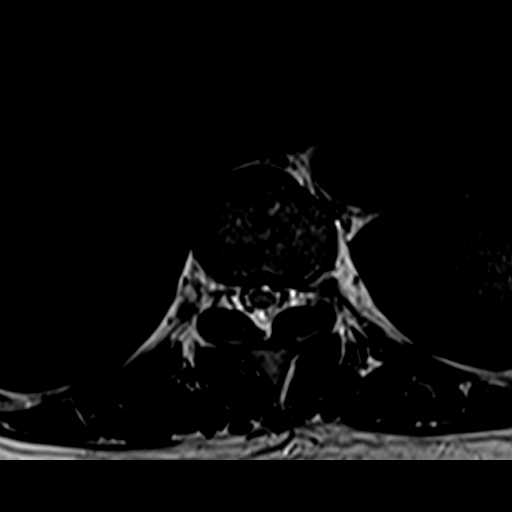
[im 17/33]
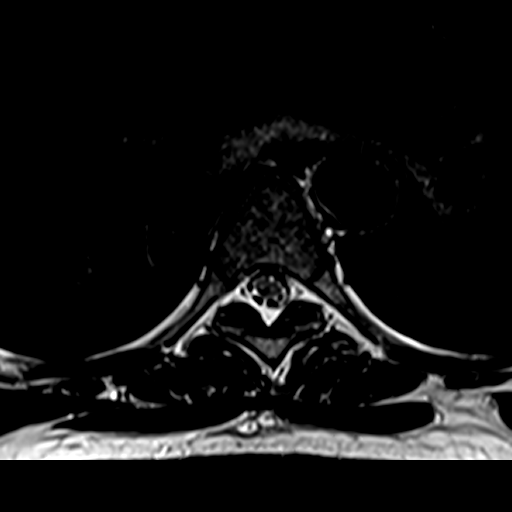
[im 27/33]
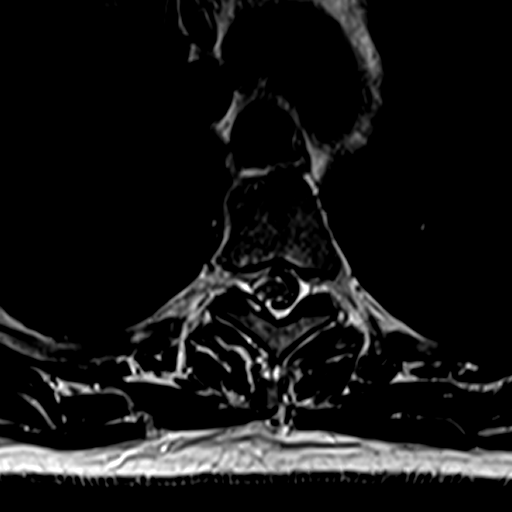

[10 of 48 positions shown; findings below may reference images not displayed]

FINDINGS: MRI THORACIC SPINE FINDINGS

Alignment:  Physiologic.

Vertebrae: No fracture, evidence of discitis, or bone lesion.

Cord:  Normal signal and morphology.

Paraspinal and other soft tissues: Negative.

Disc levels:

Disc spaces:  Disc spaces are relatively well maintained.

C7-T1: Mild broad-based disc bulge. Bilateral uncovertebral
degenerative changes. Right foraminal stenosis.

T1-T2: Minimal broad-based disc bulge. No foraminal or central canal
stenosis.

T2-T3: Mild broad-based disc bulge. No foraminal or central canal
stenosis.

T3-T4: Mild broad-based disc bulge. No foraminal or central canal
stenosis.

T4-T5: Mild broad-based disc bulge. No foraminal or central canal
stenosis.

T5-T6: Mild broad-based disc bulge. No foraminal or central canal
stenosis.

T6-T7: Mild broad-based disc bulge. No foraminal or central canal
stenosis.

T7-T8: Mild broad-based disc bulge. No foraminal or central canal
stenosis.

T8-T9: Small central disc protrusion. No foraminal or central canal
stenosis.

T9-T10: No disc protrusion, foraminal stenosis or central canal
stenosis.

T10-T11: No disc protrusion, foraminal stenosis or central canal
stenosis.

T11-T12: No disc protrusion, foraminal stenosis or central canal
stenosis.

MRI LUMBAR SPINE FINDINGS

Segmentation:  Standard.

Alignment:  Physiologic.

Vertebrae:  No fracture, evidence of discitis, or bone lesion.

Conus medullaris: Extends to the L1 level and appears normal.

Paraspinal and other soft tissues: No paraspinal abnormality.

Disc levels:

Disc spaces: Degenerative disc disease with disc desiccation at L4-5
and L5-S1 with reactive endplate changes at L5-S1.

T12-L1: No significant disc bulge. No evidence of neural foraminal
stenosis. No central canal stenosis.

L1-L2: No significant disc bulge. No evidence of neural foraminal
stenosis. No central canal stenosis.

L2-L3: No significant disc bulge. No evidence of neural foraminal
stenosis. No central canal stenosis.

L3-L4: No significant disc bulge. No evidence of neural foraminal
stenosis. No central canal stenosis.

L4-L5: Broad-based disc bulge flattening the ventral thecal sac.
Mild bilateral facet arthropathy. No evidence of neural foraminal
stenosis. No central canal stenosis.

L5-S1: Mild broad-based disc bulge. Moderate bilateral facet
arthropathy. Mild bilateral foraminal narrowing. No evidence of
neural foraminal stenosis. No central canal stenosis.
IMPRESSION: MR THORACIC SPINE IMPRESSION

1. No acute osseous injury of the thoracic spine.
2. Mild thoracic spine spondylosis as described above.

MR LUMBAR SPINE IMPRESSION

1. No acute osseous injury of the lumbar spine.
2. At L5-S1 there is a mild broad-based disc bulge. Moderate
bilateral facet arthropathy. Mild bilateral foraminal narrowing.
3. At L4-5 there is a broad-based disc bulge flattening the ventral
thecal sac. Mild bilateral facet arthropathy.

## 2018-08-19 ENCOUNTER — Other Ambulatory Visit (HOSPITAL_COMMUNITY): Payer: Self-pay | Admitting: Pulmonary Disease

## 2018-08-19 DIAGNOSIS — Z1231 Encounter for screening mammogram for malignant neoplasm of breast: Secondary | ICD-10-CM

## 2019-01-05 ENCOUNTER — Other Ambulatory Visit: Payer: Self-pay

## 2019-01-05 ENCOUNTER — Other Ambulatory Visit (HOSPITAL_COMMUNITY): Payer: Self-pay | Admitting: Pulmonary Disease

## 2019-01-05 ENCOUNTER — Ambulatory Visit (HOSPITAL_COMMUNITY)
Admission: RE | Admit: 2019-01-05 | Discharge: 2019-01-05 | Disposition: A | Discharge status: Home / Self Care | Payer: Commercial Managed Care - PPO | Source: Ambulatory Visit | Attending: Pulmonary Disease | Admitting: Pulmonary Disease

## 2019-01-05 DIAGNOSIS — R928 Other abnormal and inconclusive findings on diagnostic imaging of breast: Secondary | ICD-10-CM

## 2019-01-05 DIAGNOSIS — Z1231 Encounter for screening mammogram for malignant neoplasm of breast: Secondary | ICD-10-CM | POA: Diagnosis not present

## 2019-01-18 ENCOUNTER — Ambulatory Visit (HOSPITAL_COMMUNITY)
Admission: RE | Admit: 2019-01-18 | Discharge: 2019-01-18 | Disposition: A | Discharge status: Home / Self Care | Payer: Commercial Managed Care - PPO | Source: Ambulatory Visit | Attending: Pulmonary Disease | Admitting: Pulmonary Disease

## 2019-01-18 ENCOUNTER — Ambulatory Visit (HOSPITAL_COMMUNITY): Payer: Commercial Managed Care - PPO

## 2019-01-18 ENCOUNTER — Other Ambulatory Visit: Payer: Self-pay

## 2019-01-18 DIAGNOSIS — R928 Other abnormal and inconclusive findings on diagnostic imaging of breast: Secondary | ICD-10-CM | POA: Diagnosis present

## 2019-05-28 ENCOUNTER — Ambulatory Visit: Payer: Commercial Managed Care - PPO | Attending: Internal Medicine

## 2019-05-28 DIAGNOSIS — Z23 Encounter for immunization: Secondary | ICD-10-CM

## 2019-05-28 NOTE — Progress Notes (Signed)
   Covid-19 Vaccination Clinic  Name:  Maria Kirby    MRN: [id number] DOB: 05/24/1960  05/28/2019  [name] was observed post Covid-19 immunization for 15 minutes without incident. She was provided with Vaccine Information Sheet and instruction to access the V-Safe system.   [name] was instructed to call 911 with any severe reactions post vaccine: Marland Kitchen Difficulty breathing  . Swelling of face and throat  . A fast heartbeat  . A bad rash all over body  . Dizziness and weakness   Immunizations Administered    Name Date Dose VIS Date Route   Moderna COVID-19 Vaccine 05/28/2019 11:52 AM 0.5 mL 02/08/2019 Intramuscular   Manufacturer: Moderna   Lot: [id number]   [id number]

## 2019-06-29 ENCOUNTER — Ambulatory Visit: Payer: Commercial Managed Care - PPO | Attending: Internal Medicine

## 2019-06-29 DIAGNOSIS — Z23 Encounter for immunization: Secondary | ICD-10-CM

## 2019-06-29 NOTE — Progress Notes (Signed)
   Covid-19 Vaccination Clinic  Name:  WAJIHA JOCSON    MRN: [id number] DOB: 17-Jun-1960  06/29/2019  Ms. Vivar was observed post Covid-19 immunization for 15 minutes without incident. She was provided with Vaccine Information Sheet and instruction to access the V-Safe system.   Ms. Arcari was instructed to call 911 with any severe reactions post vaccine: Marland Kitchen Difficulty breathing  . Swelling of face and throat  . A fast heartbeat  . A bad rash all over body  . Dizziness and weakness   Immunizations Administered    Name Date Dose VIS Date Route   Moderna COVID-19 Vaccine 06/29/2019 11:57 AM 0.5 mL 02/2019 Intramuscular   Manufacturer: Moderna   Lot: [id number]   [place][id number]

## 2020-02-09 ENCOUNTER — Ambulatory Visit: Payer: Commercial Managed Care - PPO | Attending: Internal Medicine

## 2020-02-09 DIAGNOSIS — Z23 Encounter for immunization: Secondary | ICD-10-CM

## 2020-02-09 NOTE — Progress Notes (Signed)
   Covid-19 Vaccination Clinic  Name:  [name]    MRN: [id number] DOB: 05/04/1960  02/09/2020  [name] was observed post Covid-19 immunization for 15 minutes without incident. She was provided with Vaccine Information Sheet and instruction to access the V-Safe system.   [name] was instructed to call 911 with any severe reactions post vaccine: Marland Kitchen Difficulty breathing  . Swelling of face and throat  . A fast heartbeat  . A bad rash all over body  . Dizziness and weakness   Immunizations Administered    No immunizations on file.

## 2020-10-01 ENCOUNTER — Encounter: Payer: Self-pay | Admitting: *Deleted

## 2021-06-04 ENCOUNTER — Encounter: Payer: Self-pay | Admitting: *Deleted

## 2021-08-02 ENCOUNTER — Other Ambulatory Visit (HOSPITAL_COMMUNITY): Payer: Self-pay | Admitting: Nurse Practitioner

## 2021-08-02 DIAGNOSIS — Z1231 Encounter for screening mammogram for malignant neoplasm of breast: Secondary | ICD-10-CM

## 2021-08-08 ENCOUNTER — Ambulatory Visit (INDEPENDENT_AMBULATORY_CARE_PROVIDER_SITE_OTHER): Payer: Self-pay | Admitting: *Deleted

## 2021-08-08 VITALS — Ht 68.0 in | Wt 158.0 lb

## 2021-08-08 DIAGNOSIS — Z86010 Personal history of colonic polyps: Secondary | ICD-10-CM

## 2021-08-08 NOTE — Progress Notes (Signed)
Gastroenterology Pre-Procedure Review  Request Date: 08/08/2021 Requesting Physician: Perfecto Kingdom, DNP, FNP-C, 5 year recall, Last TCS 11/07/2015 by Dr. Gala Romney, tubular adenoma (x1), hyperplastic polyp (x1), family hx of colon cancer (father)  PATIENT REVIEW QUESTIONS: The patient responded to the following health history questions as indicated:    1. Diabetes Melitis: no 2. Joint replacements in the past 12 months: no 3. Major health problems in the past 3 months: no 4. Has an artificial valve or MVP: no 5. Has a defibrillator: no 6. Has been advised in past to take antibiotics in advance of a procedure like teeth cleaning: no 7. Family history of colon cancer: yes, father: age 70  8. Alcohol Use: no 9. Illicit drug Use: no 10. History of sleep apnea: no  11. History of coronary artery or other vascular stents placed within the last 12 months: no 12. History of any prior anesthesia complications: no 13. Body mass index is 24.02 kg/m.    MEDICATIONS & ALLERGIES:    Patient reports the following regarding taking any blood thinners:   Plavix? no Aspirin? no Coumadin? no Brilinta? no Xarelto? no Eliquis? no Pradaxa? no Savaysa? no Effient? no  Patient confirms/reports the following medications:  Current Outpatient Medications  Medication Sig Dispense Refill   Cholecalciferol (VITAMIN D3) 50 MCG (2000 UT) TABS Take by mouth daily at 6 (six) AM.     OTEZLA 30 MG TABS Take 1 tablet by mouth 2 (two) times daily.     No current facility-administered medications for this visit.    Patient confirms/reports the following allergies:  Allergies  Allergen Reactions   Dye Fdc Red [Red Dye]    Ivp Dye [Iodinated Contrast Media]     swelling    No orders of the defined types were placed in this encounter.   AUTHORIZATION INFORMATION Primary Insurance: [place],  [place] #: [id number],  Group #: [id number] Pre-Cert / Josem Kaufmann required: No, not required per Rulon Eisenmenger / Auth #: REF#:  [id number]  SCHEDULE INFORMATION: Procedure has been scheduled as follows:  Date: 09/23/2021, Time: 9:15  Location: [facility] with Dr. Gala Romney  This Gastroenterology Pre-Precedure Review Form is being routed to the following provider(s): Aliene Altes, PA-C

## 2021-08-08 NOTE — Progress Notes (Signed)
Okay to schedule. ASA 2 

## 2021-08-09 ENCOUNTER — Encounter: Payer: Self-pay | Admitting: *Deleted

## 2021-08-09 ENCOUNTER — Telehealth: Payer: Self-pay | Admitting: *Deleted

## 2021-08-09 ENCOUNTER — Other Ambulatory Visit: Payer: Self-pay | Admitting: *Deleted

## 2021-08-09 MED ORDER — PEG 3350-KCL-NA BICARB-NACL 420 G PO SOLR: 4000.0000 mL | Freq: Once | ORAL | 0 refills | Status: AC

## 2021-08-09 NOTE — Progress Notes (Signed)
Spoke to pt.  Scheduled procedure for 09/23/2021 at 9:15, arrival 7:45 at [facility].  Reviewed prep instructions with pt by phone.  Pt made aware that I sent RX to [facility].  She was made aware that she will need to purchase an enema and dulcolax.  Confirmed mailing address and mailed instructions.

## 2021-08-09 NOTE — Addendum Note (Signed)
Addended by: Metro Kung on: 08/09/2021 09:26 AM   Modules accepted: Orders

## 2021-08-09 NOTE — Telephone Encounter (Signed)
Called [facility] and requested passcode to try to get authorization.

## 2021-09-23 ENCOUNTER — Ambulatory Visit (HOSPITAL_COMMUNITY)
Admission: RE | Admit: 2021-09-23 | Discharge: 2021-09-23 | Disposition: A | Discharge status: Home / Self Care | Payer: Commercial Managed Care - PPO | Source: Ambulatory Visit | Attending: Internal Medicine | Admitting: Internal Medicine

## 2021-09-23 ENCOUNTER — Other Ambulatory Visit: Payer: Self-pay

## 2021-09-23 ENCOUNTER — Ambulatory Visit (HOSPITAL_COMMUNITY): Payer: Commercial Managed Care - PPO | Admitting: Anesthesiology

## 2021-09-23 ENCOUNTER — Encounter (HOSPITAL_COMMUNITY): Payer: Self-pay | Admitting: Internal Medicine

## 2021-09-23 ENCOUNTER — Encounter (HOSPITAL_COMMUNITY)
Admission: RE | Disposition: A | Discharge status: Home / Self Care | Payer: Self-pay | Source: Ambulatory Visit | Attending: Internal Medicine

## 2021-09-23 ENCOUNTER — Ambulatory Visit (HOSPITAL_BASED_OUTPATIENT_CLINIC_OR_DEPARTMENT_OTHER): Payer: Commercial Managed Care - PPO | Admitting: Anesthesiology

## 2021-09-23 DIAGNOSIS — K640 First degree hemorrhoids: Secondary | ICD-10-CM | POA: Diagnosis not present

## 2021-09-23 DIAGNOSIS — Z09 Encounter for follow-up examination after completed treatment for conditions other than malignant neoplasm: Secondary | ICD-10-CM | POA: Diagnosis not present

## 2021-09-23 DIAGNOSIS — L409 Psoriasis, unspecified: Secondary | ICD-10-CM | POA: Diagnosis not present

## 2021-09-23 DIAGNOSIS — D124 Benign neoplasm of descending colon: Secondary | ICD-10-CM

## 2021-09-23 DIAGNOSIS — R1013 Epigastric pain: Secondary | ICD-10-CM

## 2021-09-23 DIAGNOSIS — F17210 Nicotine dependence, cigarettes, uncomplicated: Secondary | ICD-10-CM | POA: Insufficient documentation

## 2021-09-23 DIAGNOSIS — Z1211 Encounter for screening for malignant neoplasm of colon: Secondary | ICD-10-CM | POA: Insufficient documentation

## 2021-09-23 DIAGNOSIS — Z86010 Personal history of colonic polyps: Secondary | ICD-10-CM | POA: Insufficient documentation

## 2021-09-23 DIAGNOSIS — R1319 Other dysphagia: Secondary | ICD-10-CM

## 2021-09-23 DIAGNOSIS — K635 Polyp of colon: Secondary | ICD-10-CM | POA: Insufficient documentation

## 2021-09-23 DIAGNOSIS — K769 Liver disease, unspecified: Secondary | ICD-10-CM

## 2021-09-23 HISTORY — PX: POLYPECTOMY: SHX5525

## 2021-09-23 HISTORY — PX: COLONOSCOPY WITH PROPOFOL: SHX5780

## 2021-09-23 SURGERY — COLONOSCOPY WITH PROPOFOL
Anesthesia: General

## 2021-09-23 MED ORDER — CHLORHEXIDINE GLUCONATE CLOTH 2 % EX PADS: 6.0000 | MEDICATED_PAD | Freq: Once | CUTANEOUS | Status: DC

## 2021-09-23 MED ORDER — PROPOFOL 500 MG/50ML IV EMUL
INTRAVENOUS | Status: DC | PRN
  Administered 2021-09-23: 150 ug/kg/min via INTRAVENOUS

## 2021-09-23 MED ORDER — GLYCOPYRROLATE 0.2 MG/ML IJ SOLN
INTRAMUSCULAR | Status: DC | PRN
  Administered 2021-09-23: .2 mg via INTRAVENOUS

## 2021-09-23 MED ORDER — LIDOCAINE HCL URETHRAL/MUCOSAL 2 % EX GEL
CUTANEOUS | Status: DC | PRN
  Administered 2021-09-23: 50 via TOPICAL

## 2021-09-23 MED ORDER — LACTATED RINGERS IV SOLN: INTRAVENOUS | Status: DC

## 2021-09-23 MED ORDER — GLYCOPYRROLATE PF 0.2 MG/ML IJ SOSY
PREFILLED_SYRINGE | INTRAMUSCULAR | Status: AC
  Filled 2021-09-23: qty 1

## 2021-09-23 MED ORDER — PROPOFOL 500 MG/50ML IV EMUL
INTRAVENOUS | Status: AC
  Filled 2021-09-23: qty 50

## 2021-09-23 MED ORDER — LIDOCAINE HCL (PF) 2 % IJ SOLN
INTRAMUSCULAR | Status: AC
  Filled 2021-09-23: qty 5

## 2021-09-23 NOTE — Anesthesia Postprocedure Evaluation (Signed)
Anesthesia Post Note  Patient: Maria Kirby  Procedure(s) Performed: COLONOSCOPY WITH PROPOFOL POLYPECTOMY  Patient location during evaluation: Endoscopy Anesthesia Type: General Level of consciousness: awake and alert and oriented Pain management: pain level controlled Vital Signs Assessment: post-procedure vital signs reviewed and stable Respiratory status: spontaneous breathing, nonlabored ventilation and respiratory function stable Cardiovascular status: blood pressure returned to baseline and stable Postop Assessment: no apparent nausea or vomiting Anesthetic complications: no   There were no known notable events for this encounter.   Last Vitals:  Vitals:   09/23/21 0921 09/23/21 0924  BP: (!) 80/43 112/65  Pulse: 67 68  Resp: 17 18  Temp:    SpO2: 95% 95%    Last Pain:  Vitals:   09/23/21 0924  TempSrc:   PainSc: 0-No pain                 Charo Philipp C Adeeb Konecny

## 2021-09-23 NOTE — Op Note (Signed)
[facility] Patient Name: Maria Kirby Procedure Date: 09/23/2021 8:50 AM MRN: [id number] Date of Birth: 10/09/1960 Attending MD: Norvel Richards , MD CSN: [id number] Age: 61 Admit Type: Outpatient Procedure:                Colonoscopy Indications:              High risk colon cancer surveillance: Personal                            history of colonic polyps Providers:                Norvel Richards, MD, Janeece Riggers, RN, Raphael Gibney, Technician Referring MD:              Medicines:                Propofol per Anesthesia Complications:            No immediate complications. Estimated Blood Loss:     Estimated blood loss was minimal. Procedure:                Pre-Anesthesia Assessment:                           - Prior to the procedure, a History and Physical                            was performed, and patient medications and                            allergies were reviewed. The patient's tolerance of                            previous anesthesia was also reviewed. The risks                            and benefits of the procedure and the sedation                            options and risks were discussed with the patient.                            All questions were answered, and informed consent                            was obtained. Prior Anticoagulants: The patient has                            taken no previous anticoagulant or antiplatelet                            agents. ASA Grade Assessment: II - A patient with  mild systemic disease. After reviewing the risks                            and benefits, the patient was deemed in                            satisfactory condition to undergo the procedure.                           After obtaining informed consent, the colonoscope                            was passed under direct vision. Throughout the                            procedure, the patient's  blood pressure, pulse, and                            oxygen saturations were monitored continuously. The                            [id number]) scope was introduced through the                            anus and advanced to the the cecum, identified by                            appendiceal orifice and ileocecal valve. The                            colonoscopy was performed without difficulty. The                            patient tolerated the procedure well. The quality                            of the bowel preparation was adequate. Scope In: 9:04:51 AM Scope Out: 9:18:37 AM Scope Withdrawal Time: 0 hours 11 minutes 11 seconds  Total Procedure Duration: 0 hours 13 minutes 46 seconds  Findings:      The perianal and digital rectal examinations were normal.      Two semi-pedunculated polyps were found in the descending colon. The       polyps were 4 to 6 mm in size. These polyps were removed with a cold       snare. Resection and retrieval were complete. Estimated blood loss was       minimal.      A 15 mm polyp was found in the descending colon. The polyp was sessile.       The polyp was removed with a hot snare. Resection and retrieval were       complete. Estimated blood loss: none.      Non-bleeding internal hemorrhoids and anal papilla were found during       retroflexion. The hemorrhoids were moderate, medium-sized and Grade I       (internal hemorrhoids that do not prolapse).  The exam was otherwise without abnormality on direct and retroflexion       views. Impression:               - Two 4 to 6 mm polyps in the descending colon,                            removed with a cold snare. Resected and retrieved.                           - One 15 mm polyp in the descending colon, removed                            with a hot snare. Resected and retrieved.                           - Non-bleeding internal hemorrhoids.                           - The examination was  otherwise normal on direct                            and retroflexion views. Moderate Sedation:      Moderate (conscious) sedation was personally administered by an       anesthesia professional. The following parameters were monitored: oxygen       saturation, heart rate, blood pressure, and response to care. Recommendation:           - Patient has a contact number available for                            emergencies. The signs and symptoms of potential                            delayed complications were discussed with the                            patient. Return to normal activities tomorrow.                            Written discharge instructions were provided to the                            patient.                           - Resume previous diet.                           - Repeat colonoscopy date to be determined after                            pending pathology results are reviewed for                            surveillance.                           -  Return to GI office (date not yet determined). Procedure Code(s):        --- Professional ---                           [phone], Colonoscopy, flexible; with removal of                            tumor(s), polyp(s), or other lesion(s) by snare                            technique Diagnosis Code(s):        --- Professional ---                           K63.5, Polyp of colon                           Z86.010, Personal history of colonic polyps                           K64.0, First degree hemorrhoids CPT copyright 2019 [employer]. All rights reserved. The codes documented in this report are preliminary and upon coder review may  be revised to meet current compliance requirements. Cristopher Estimable. Tuere Nwosu, MD Norvel Richards, MD 09/23/2021 9:28:06 AM This report has been signed electronically. Number of Addenda: 0

## 2021-09-23 NOTE — Transfer of Care (Signed)
Immediate Anesthesia Transfer of Care Note  Patient: Maria Kirby  Procedure(s) Performed: COLONOSCOPY WITH PROPOFOL POLYPECTOMY  Patient Location: PACU and Endoscopy Unit  Anesthesia Type:General  Level of Consciousness: awake  Airway & Oxygen Therapy: Patient Spontanous Breathing  Post-op Assessment: Report given to RN  Post vital signs: Reviewed  Last Vitals:  Vitals Value Taken Time  BP    Temp    Pulse    Resp    SpO2      Last Pain:  Vitals:   09/23/21 0758  TempSrc: Oral  PainSc: 0-No pain      Patients Stated Pain Goal: 10 (47/[birth year] 8366)  Complications: There were no known notable events for this encounter.

## 2021-09-23 NOTE — Anesthesia Preprocedure Evaluation (Addendum)
Anesthesia Evaluation  Patient identified by MRN, date of birth, ID band Patient awake    Reviewed: Allergy & Precautions, NPO status , Patient's Chart, lab work & pertinent test results  Airway Mallampati: II  TM Distance: >3 FB Neck ROM: Full    Dental  (+) Dental Advisory Given, Edentulous Upper, Missing   Pulmonary Current Smoker and Patient abstained from smoking.,    Pulmonary exam normal breath sounds clear to auscultation       Cardiovascular negative cardio ROS Normal cardiovascular exam Rhythm:Regular Rate:Normal     Neuro/Psych negative neurological ROS  negative psych ROS   GI/Hepatic Neg liver ROS, GERD  ,  Endo/Other  negative endocrine ROS  Renal/GU negative Renal ROS  negative genitourinary   Musculoskeletal negative musculoskeletal ROS (+)   Abdominal   Peds negative pediatric ROS (+)  Hematology  (+) Blood dyscrasia, anemia ,   Anesthesia Other Findings Psoriasis   Reproductive/Obstetrics negative OB ROS                            Anesthesia Physical Anesthesia Plan  ASA: 2  Anesthesia Plan: General   Post-op Pain Management: Minimal or no pain anticipated   Induction: Intravenous  PONV Risk Score and Plan: Propofol infusion  Airway Management Planned: Nasal Cannula and Natural Airway  Additional Equipment:   Intra-op Plan:   Post-operative Plan:   Informed Consent: I have reviewed the patients History and Physical, chart, labs and discussed the procedure including the risks, benefits and alternatives for the proposed anesthesia with the patient or authorized representative who has indicated his/her understanding and acceptance.     Dental advisory given  Plan Discussed with: CRNA  Anesthesia Plan Comments:         Anesthesia Quick Evaluation

## 2021-09-23 NOTE — H&P (Signed)
$'@LOGO'X$ @   Primary Care Physician:  Perfecto Kingdom, NP Primary Gastroenterologist:  Dr. Gala Romney  Pre-Procedure History & Physical: HPI:  Maria Kirby is a 61 y.o. female here for surveillance colonoscopy.  History of colonic adenomas removed 2017.  No bowel symptoms.  No family history of colon cancer.  Past Medical History:  Diagnosis Date   Anemia    Breast cancer ([place])    left breast cancer. PATIENT DENIES 10/21/16   GERD (gastroesophageal reflux disease)    Personal history of radiation therapy     Past Surgical History:  Procedure Laterality Date   ABDOMINAL HYSTERECTOMY     COLONOSCOPY     COLONOSCOPY N/A 11/07/2015   Dr. Gala Romney: two 4 mm polyps in one 9 mm removed from the splenic flexure, one tubular adenoma. Colonoscopy in August 2022.    ESOPHAGOGASTRODUODENOSCOPY (EGD) WITH PROPOFOL N/A 07/21/2016   Procedure: ESOPHAGOGASTRODUODENOSCOPY (EGD) WITH PROPOFOL;  Surgeon: Daneil Dolin, MD;  Location: [facility];  Service: Endoscopy;  Laterality: N/A;  7:30am   EXPLORATORY LAPAROTOMY     MALONEY DILATION N/A 07/21/2016   Procedure: MALONEY DILATION;  Surgeon: Daneil Dolin, MD;  Location: [facility];  Service: Endoscopy;  Laterality: N/A;    Prior to Admission medications   Medication Sig Start Date End Date Taking? Authorizing Provider  Cholecalciferol (VITAMIN D3) 50 MCG (2000 UT) TABS Take 2,000 Units by mouth daily.   Yes [provider]  OTEZLA 30 MG TABS Take 30 mg by mouth 2 (two) times daily. 07/15/21  Yes [provider]    Allergies as of 08/09/2021 - Review Complete 08/08/2021  Allergen Reaction Noted   Dye fdc red [red dye]  02/15/2014   Ivp dye [iodinated contrast media]  06/13/2016    Family History  Problem Relation Age of Onset   Diabetes Mother    Hypertension Mother    Cancer Father 2       colon   Colon cancer Father     Social History   Socioeconomic History   Marital status: Single    Spouse name: Not on file    Number of children: Not on file   Years of education: Not on file   Highest education level: Not on file  Occupational History   Not on file  Tobacco Use   Smoking status: Every Day    Packs/day: 0.50    Years: 30.00    Total pack years: 15.00    Types: Cigarettes   Smokeless tobacco: Never  Vaping Use   Vaping Use: Never used  Substance and Sexual Activity   Alcohol use: No    Alcohol/week: 0.0 standard drinks of alcohol   Drug use: No   Sexual activity: Not Currently  Other Topics Concern   Not on file  Social History Narrative   Not on file   Social Determinants of Health   Financial Resource Strain: Not on file  Food Insecurity: Not on file  Transportation Needs: Not on file  Physical Activity: Not on file  Stress: Not on file  Social Connections: Not on file  Intimate Partner Violence: Not on file    Review of Systems: See HPI, otherwise negative ROS  Physical Exam: BP 138/79   Temp 97.8 F (36.6 C) (Oral)   Resp 15   Ht 5' 7.5" (1.715 m)   Wt 70.3 kg   SpO2 97%   BMI 23.92 kg/m  General:   Alert,  Well-developed, well-nourished, pleasant and cooperative  in NAD Neck:  Supple; no masses or thyromegaly. No significant cervical adenopathy. Lungs:  Clear throughout to auscultation.   No wheezes, crackles, or rhonchi. No acute distress. Heart:  Regular rate and rhythm; no murmurs, clicks, rubs,  or gallops. Abdomen: Non-distended, normal bowel sounds.  Soft and nontender without appreciable mass or hepatosplenomegaly.  Pulses:  Normal pulses noted. Extremities:  Without clubbing or edema.   Impression/Plan: 61 year old lady here for surveillance colonoscopy.  History of colonic adenoma. The risks, benefits, limitations, alternatives and imponderables have been reviewed with the patient. Questions have been answered. All parties are agreeable.       Notice: This dictation was prepared with Dragon dictation along with smaller phrase technology. Any  transcriptional errors that result from this process are unintentional and may not be corrected upon review.

## 2021-09-23 NOTE — Discharge Instructions (Signed)
  Colonoscopy Discharge Instructions  Read the instructions outlined below and refer to this sheet in the next few weeks. These discharge instructions provide you with general information on caring for yourself after you leave the hospital. Your doctor may also give you specific instructions. While your treatment has been planned according to the most current medical practices available, unavoidable complications occasionally occur. If you have any problems or questions after discharge, call Dr. Gala Romney at [phone]. ACTIVITY You may resume your regular activity, but move at a slower pace for the next 24 hours.  Take frequent rest periods for the next 24 hours.  Walking will help get rid of the air and reduce the bloated feeling in your belly (abdomen).  No driving for 24 hours (because of the medicine (anesthesia) used during the test).   Do not sign any important legal documents or operate any machinery for 24 hours (because of the anesthesia used during the test).  NUTRITION Drink plenty of fluids.  You may resume your normal diet as instructed by your doctor.  Begin with a light meal and progress to your normal diet. Heavy or fried foods are harder to digest and may make you feel sick to your stomach (nauseated).  Avoid alcoholic beverages for 24 hours or as instructed.  MEDICATIONS You may resume your normal medications unless your doctor tells you otherwise.  WHAT YOU CAN EXPECT TODAY Some feelings of bloating in the abdomen.  Passage of more gas than usual.  Spotting of blood in your stool or on the toilet paper.  IF YOU HAD POLYPS REMOVED DURING THE COLONOSCOPY: No aspirin products for 7 days or as instructed.  No alcohol for 7 days or as instructed.  Eat a soft diet for the next 24 hours.  FINDING OUT THE RESULTS OF YOUR TEST Not all test results are available during your visit. If your test results are not back during the visit, make an appointment with your caregiver to find out the  results. Do not assume everything is normal if you have not heard from your caregiver or the medical facility. It is important for you to follow up on all of your test results.  SEEK IMMEDIATE MEDICAL ATTENTION IF: You have more than a spotting of blood in your stool.  Your belly is swollen (abdominal distention).  You are nauseated or vomiting.  You have a temperature over 101.  You have abdominal pain or discomfort that is severe or gets worse throughout the day.     3 polyps removed in your colon today   Colon polyp and diverticulosis information provided   Further recommendations to follow pending review of pathology report  At patient request, I called Kippi King at [phone] -reviewed findings and recommendations

## 2021-09-24 LAB — SURGICAL PATHOLOGY

## 2021-09-26 ENCOUNTER — Encounter (HOSPITAL_COMMUNITY): Payer: Self-pay | Admitting: Internal Medicine

## 2021-09-27 ENCOUNTER — Encounter: Payer: Self-pay | Admitting: Internal Medicine

## 2021-11-14 ENCOUNTER — Ambulatory Visit (HOSPITAL_COMMUNITY)
Admission: RE | Admit: 2021-11-14 | Discharge: 2021-11-14 | Disposition: A | Discharge status: Home / Self Care | Payer: Commercial Managed Care - PPO | Source: Ambulatory Visit | Attending: Nurse Practitioner | Admitting: Nurse Practitioner

## 2021-11-14 DIAGNOSIS — Z1231 Encounter for screening mammogram for malignant neoplasm of breast: Secondary | ICD-10-CM | POA: Insufficient documentation

## 2023-09-03 ENCOUNTER — Other Ambulatory Visit (HOSPITAL_COMMUNITY): Payer: Self-pay | Admitting: Family Medicine

## 2023-09-03 DIAGNOSIS — Z1231 Encounter for screening mammogram for malignant neoplasm of breast: Secondary | ICD-10-CM

## 2023-12-25 ENCOUNTER — Other Ambulatory Visit (HOSPITAL_COMMUNITY): Payer: Self-pay | Admitting: Nurse Practitioner

## 2023-12-25 DIAGNOSIS — Z1231 Encounter for screening mammogram for malignant neoplasm of breast: Secondary | ICD-10-CM

## 2023-12-30 ENCOUNTER — Ambulatory Visit (HOSPITAL_COMMUNITY)
Admission: RE | Admit: 2023-12-30 | Discharge: 2023-12-30 | Disposition: A | Discharge status: Home / Self Care | Source: Ambulatory Visit | Attending: Nurse Practitioner | Admitting: Nurse Practitioner

## 2023-12-30 ENCOUNTER — Encounter (HOSPITAL_COMMUNITY): Payer: Self-pay

## 2023-12-30 DIAGNOSIS — Z1231 Encounter for screening mammogram for malignant neoplasm of breast: Secondary | ICD-10-CM | POA: Insufficient documentation
